# Patient Record
Sex: Male | Born: 1935 | Race: White | Hispanic: No | Marital: Married | Smoking: Former smoker
Health system: Southern US, Community
[De-identification: ages and names within clinical notes are randomized; demographics above are authoritative.]

## PROBLEM LIST (undated history)

## (undated) DIAGNOSIS — I10 Essential (primary) hypertension: Secondary | ICD-10-CM

## (undated) DIAGNOSIS — K219 Gastro-esophageal reflux disease without esophagitis: Secondary | ICD-10-CM

## (undated) DIAGNOSIS — M1990 Unspecified osteoarthritis, unspecified site: Secondary | ICD-10-CM

## (undated) DIAGNOSIS — E1165 Type 2 diabetes mellitus with hyperglycemia: Secondary | ICD-10-CM

## (undated) DIAGNOSIS — C801 Malignant (primary) neoplasm, unspecified: Secondary | ICD-10-CM

## (undated) DIAGNOSIS — I6523 Occlusion and stenosis of bilateral carotid arteries: Secondary | ICD-10-CM

## (undated) DIAGNOSIS — I25119 Atherosclerotic heart disease of native coronary artery with unspecified angina pectoris: Secondary | ICD-10-CM

## (undated) DIAGNOSIS — Z955 Presence of coronary angioplasty implant and graft: Secondary | ICD-10-CM

## (undated) DIAGNOSIS — I219 Acute myocardial infarction, unspecified: Secondary | ICD-10-CM

## (undated) DIAGNOSIS — I499 Cardiac arrhythmia, unspecified: Secondary | ICD-10-CM

## (undated) DIAGNOSIS — E119 Type 2 diabetes mellitus without complications: Secondary | ICD-10-CM

## (undated) DIAGNOSIS — I2510 Atherosclerotic heart disease of native coronary artery without angina pectoris: Secondary | ICD-10-CM

## (undated) DIAGNOSIS — E785 Hyperlipidemia, unspecified: Secondary | ICD-10-CM

## (undated) HISTORY — DX: Gastro-esophageal reflux disease without esophagitis: K21.9

## (undated) HISTORY — DX: Type 2 diabetes mellitus with hyperglycemia: E11.65

## (undated) HISTORY — PX: CATARACT EXTRACTION: SUR2

## (undated) HISTORY — DX: Atherosclerotic heart disease of native coronary artery with unspecified angina pectoris: I25.119

## (undated) HISTORY — PX: BACK SURGERY: SHX140

## (undated) HISTORY — DX: Occlusion and stenosis of bilateral carotid arteries: I65.23

## (undated) HISTORY — PX: SKIN BIOPSY: SHX1

## (undated) HISTORY — PX: SHOULDER ARTHROSCOPY W/ ROTATOR CUFF REPAIR: SHX2400

## (undated) HISTORY — PX: INGUINAL HERNIA REPAIR: SUR1180

## (undated) HISTORY — DX: Hyperlipidemia, unspecified: E78.5

## (undated) HISTORY — DX: Essential (primary) hypertension: I10

## (undated) HISTORY — PX: KNEE ARTHROSCOPY: SUR90

## (undated) HISTORY — PX: CAROTID ENDARTERECTOMY: SUR193

## (undated) HISTORY — DX: Presence of coronary angioplasty implant and graft: Z95.5

---

## 2011-10-15 DIAGNOSIS — M76899 Other specified enthesopathies of unspecified lower limb, excluding foot: Secondary | ICD-10-CM | POA: Diagnosis not present

## 2011-10-15 DIAGNOSIS — M25559 Pain in unspecified hip: Secondary | ICD-10-CM | POA: Diagnosis not present

## 2011-10-18 DIAGNOSIS — M47817 Spondylosis without myelopathy or radiculopathy, lumbosacral region: Secondary | ICD-10-CM | POA: Diagnosis not present

## 2011-10-18 DIAGNOSIS — M5137 Other intervertebral disc degeneration, lumbosacral region: Secondary | ICD-10-CM | POA: Diagnosis not present

## 2011-10-18 DIAGNOSIS — M5126 Other intervertebral disc displacement, lumbar region: Secondary | ICD-10-CM | POA: Diagnosis not present

## 2011-10-22 DIAGNOSIS — M48061 Spinal stenosis, lumbar region without neurogenic claudication: Secondary | ICD-10-CM | POA: Diagnosis not present

## 2011-10-28 DIAGNOSIS — M48061 Spinal stenosis, lumbar region without neurogenic claudication: Secondary | ICD-10-CM | POA: Diagnosis not present

## 2011-10-28 DIAGNOSIS — M4310 Spondylolisthesis, site unspecified: Secondary | ICD-10-CM | POA: Diagnosis not present

## 2011-10-28 DIAGNOSIS — Q762 Congenital spondylolisthesis: Secondary | ICD-10-CM | POA: Diagnosis not present

## 2011-11-11 DIAGNOSIS — M4310 Spondylolisthesis, site unspecified: Secondary | ICD-10-CM | POA: Diagnosis not present

## 2011-11-11 DIAGNOSIS — M4800 Spinal stenosis, site unspecified: Secondary | ICD-10-CM | POA: Diagnosis not present

## 2011-12-01 DIAGNOSIS — G4733 Obstructive sleep apnea (adult) (pediatric): Secondary | ICD-10-CM | POA: Diagnosis not present

## 2011-12-20 DIAGNOSIS — M25569 Pain in unspecified knee: Secondary | ICD-10-CM | POA: Diagnosis not present

## 2011-12-31 DIAGNOSIS — I10 Essential (primary) hypertension: Secondary | ICD-10-CM | POA: Diagnosis not present

## 2011-12-31 DIAGNOSIS — R252 Cramp and spasm: Secondary | ICD-10-CM | POA: Diagnosis not present

## 2011-12-31 DIAGNOSIS — R609 Edema, unspecified: Secondary | ICD-10-CM | POA: Diagnosis not present

## 2011-12-31 DIAGNOSIS — M1710 Unilateral primary osteoarthritis, unspecified knee: Secondary | ICD-10-CM | POA: Diagnosis not present

## 2012-01-01 DIAGNOSIS — R064 Hyperventilation: Secondary | ICD-10-CM | POA: Diagnosis not present

## 2012-01-01 DIAGNOSIS — R609 Edema, unspecified: Secondary | ICD-10-CM | POA: Diagnosis not present

## 2012-01-09 DIAGNOSIS — M25569 Pain in unspecified knee: Secondary | ICD-10-CM | POA: Diagnosis not present

## 2012-01-10 DIAGNOSIS — Q820 Hereditary lymphedema: Secondary | ICD-10-CM | POA: Diagnosis not present

## 2012-01-10 DIAGNOSIS — M25469 Effusion, unspecified knee: Secondary | ICD-10-CM | POA: Diagnosis not present

## 2012-01-10 DIAGNOSIS — M1710 Unilateral primary osteoarthritis, unspecified knee: Secondary | ICD-10-CM | POA: Diagnosis not present

## 2012-01-10 DIAGNOSIS — IMO0002 Reserved for concepts with insufficient information to code with codable children: Secondary | ICD-10-CM | POA: Diagnosis not present

## 2012-01-16 DIAGNOSIS — M25569 Pain in unspecified knee: Secondary | ICD-10-CM | POA: Diagnosis not present

## 2012-01-22 DIAGNOSIS — S83289A Other tear of lateral meniscus, current injury, unspecified knee, initial encounter: Secondary | ICD-10-CM | POA: Diagnosis not present

## 2012-01-22 DIAGNOSIS — IMO0002 Reserved for concepts with insufficient information to code with codable children: Secondary | ICD-10-CM | POA: Diagnosis not present

## 2012-01-22 DIAGNOSIS — M9420 Chondromalacia, unspecified site: Secondary | ICD-10-CM | POA: Diagnosis not present

## 2012-01-22 DIAGNOSIS — X58XXXA Exposure to other specified factors, initial encounter: Secondary | ICD-10-CM | POA: Diagnosis not present

## 2012-01-22 DIAGNOSIS — M2240 Chondromalacia patellae, unspecified knee: Secondary | ICD-10-CM | POA: Diagnosis not present

## 2012-01-22 DIAGNOSIS — Y929 Unspecified place or not applicable: Secondary | ICD-10-CM | POA: Diagnosis not present

## 2012-01-30 DIAGNOSIS — R609 Edema, unspecified: Secondary | ICD-10-CM | POA: Diagnosis not present

## 2012-01-30 DIAGNOSIS — R7309 Other abnormal glucose: Secondary | ICD-10-CM | POA: Diagnosis not present

## 2012-02-27 DIAGNOSIS — Z1211 Encounter for screening for malignant neoplasm of colon: Secondary | ICD-10-CM | POA: Diagnosis not present

## 2012-02-27 DIAGNOSIS — Z79899 Other long term (current) drug therapy: Secondary | ICD-10-CM | POA: Diagnosis not present

## 2012-02-27 DIAGNOSIS — G4700 Insomnia, unspecified: Secondary | ICD-10-CM | POA: Diagnosis not present

## 2012-02-27 DIAGNOSIS — E559 Vitamin D deficiency, unspecified: Secondary | ICD-10-CM | POA: Diagnosis not present

## 2012-02-27 DIAGNOSIS — M1990 Unspecified osteoarthritis, unspecified site: Secondary | ICD-10-CM | POA: Diagnosis not present

## 2012-02-27 DIAGNOSIS — E8881 Metabolic syndrome: Secondary | ICD-10-CM | POA: Diagnosis not present

## 2012-02-27 DIAGNOSIS — Z125 Encounter for screening for malignant neoplasm of prostate: Secondary | ICD-10-CM | POA: Diagnosis not present

## 2012-03-23 DIAGNOSIS — R141 Gas pain: Secondary | ICD-10-CM | POA: Diagnosis not present

## 2012-03-23 DIAGNOSIS — Z79899 Other long term (current) drug therapy: Secondary | ICD-10-CM | POA: Diagnosis not present

## 2012-03-23 DIAGNOSIS — R142 Eructation: Secondary | ICD-10-CM | POA: Diagnosis not present

## 2012-03-26 DIAGNOSIS — I10 Essential (primary) hypertension: Secondary | ICD-10-CM | POA: Diagnosis not present

## 2012-03-26 DIAGNOSIS — Z79899 Other long term (current) drug therapy: Secondary | ICD-10-CM | POA: Diagnosis not present

## 2012-03-31 DIAGNOSIS — R1084 Generalized abdominal pain: Secondary | ICD-10-CM | POA: Diagnosis not present

## 2012-03-31 DIAGNOSIS — R509 Fever, unspecified: Secondary | ICD-10-CM | POA: Diagnosis not present

## 2012-03-31 DIAGNOSIS — R109 Unspecified abdominal pain: Secondary | ICD-10-CM | POA: Diagnosis not present

## 2012-03-31 DIAGNOSIS — R141 Gas pain: Secondary | ICD-10-CM | POA: Diagnosis not present

## 2012-03-31 DIAGNOSIS — M5137 Other intervertebral disc degeneration, lumbosacral region: Secondary | ICD-10-CM | POA: Diagnosis not present

## 2012-03-31 DIAGNOSIS — Q619 Cystic kidney disease, unspecified: Secondary | ICD-10-CM | POA: Diagnosis not present

## 2012-03-31 DIAGNOSIS — R935 Abnormal findings on diagnostic imaging of other abdominal regions, including retroperitoneum: Secondary | ICD-10-CM | POA: Diagnosis not present

## 2012-03-31 DIAGNOSIS — K5730 Diverticulosis of large intestine without perforation or abscess without bleeding: Secondary | ICD-10-CM | POA: Diagnosis not present

## 2012-03-31 DIAGNOSIS — R142 Eructation: Secondary | ICD-10-CM | POA: Diagnosis not present

## 2012-04-07 DIAGNOSIS — R143 Flatulence: Secondary | ICD-10-CM | POA: Diagnosis not present

## 2012-04-07 DIAGNOSIS — R112 Nausea with vomiting, unspecified: Secondary | ICD-10-CM | POA: Diagnosis not present

## 2012-04-07 DIAGNOSIS — R141 Gas pain: Secondary | ICD-10-CM | POA: Diagnosis not present

## 2012-04-07 DIAGNOSIS — R1013 Epigastric pain: Secondary | ICD-10-CM | POA: Diagnosis not present

## 2012-04-08 DIAGNOSIS — K2940 Chronic atrophic gastritis without bleeding: Secondary | ICD-10-CM | POA: Diagnosis not present

## 2012-04-08 DIAGNOSIS — R634 Abnormal weight loss: Secondary | ICD-10-CM | POA: Diagnosis not present

## 2012-04-08 DIAGNOSIS — R110 Nausea: Secondary | ICD-10-CM | POA: Diagnosis not present

## 2012-04-08 DIAGNOSIS — K219 Gastro-esophageal reflux disease without esophagitis: Secondary | ICD-10-CM | POA: Diagnosis not present

## 2012-04-08 DIAGNOSIS — R1013 Epigastric pain: Secondary | ICD-10-CM | POA: Diagnosis not present

## 2012-04-08 DIAGNOSIS — B9689 Other specified bacterial agents as the cause of diseases classified elsewhere: Secondary | ICD-10-CM | POA: Diagnosis not present

## 2012-04-08 DIAGNOSIS — R109 Unspecified abdominal pain: Secondary | ICD-10-CM | POA: Diagnosis not present

## 2012-04-13 DIAGNOSIS — R1013 Epigastric pain: Secondary | ICD-10-CM | POA: Diagnosis not present

## 2012-04-30 DIAGNOSIS — M25539 Pain in unspecified wrist: Secondary | ICD-10-CM | POA: Diagnosis not present

## 2012-04-30 DIAGNOSIS — M109 Gout, unspecified: Secondary | ICD-10-CM | POA: Diagnosis not present

## 2012-04-30 DIAGNOSIS — M25549 Pain in joints of unspecified hand: Secondary | ICD-10-CM | POA: Diagnosis not present

## 2012-04-30 DIAGNOSIS — R52 Pain, unspecified: Secondary | ICD-10-CM | POA: Diagnosis not present

## 2012-05-04 DIAGNOSIS — R143 Flatulence: Secondary | ICD-10-CM | POA: Diagnosis not present

## 2012-05-04 DIAGNOSIS — R1084 Generalized abdominal pain: Secondary | ICD-10-CM | POA: Diagnosis not present

## 2012-05-04 DIAGNOSIS — A048 Other specified bacterial intestinal infections: Secondary | ICD-10-CM | POA: Diagnosis not present

## 2012-05-29 DIAGNOSIS — D485 Neoplasm of uncertain behavior of skin: Secondary | ICD-10-CM | POA: Diagnosis not present

## 2012-05-29 DIAGNOSIS — R233 Spontaneous ecchymoses: Secondary | ICD-10-CM | POA: Diagnosis not present

## 2012-05-29 DIAGNOSIS — L570 Actinic keratosis: Secondary | ICD-10-CM | POA: Diagnosis not present

## 2012-06-03 DIAGNOSIS — R5383 Other fatigue: Secondary | ICD-10-CM | POA: Diagnosis not present

## 2012-06-03 DIAGNOSIS — Z23 Encounter for immunization: Secondary | ICD-10-CM | POA: Diagnosis not present

## 2012-06-03 DIAGNOSIS — R110 Nausea: Secondary | ICD-10-CM | POA: Diagnosis not present

## 2012-06-03 DIAGNOSIS — D539 Nutritional anemia, unspecified: Secondary | ICD-10-CM | POA: Diagnosis not present

## 2012-06-11 DIAGNOSIS — R110 Nausea: Secondary | ICD-10-CM | POA: Diagnosis not present

## 2012-07-08 DIAGNOSIS — A048 Other specified bacterial intestinal infections: Secondary | ICD-10-CM | POA: Diagnosis not present

## 2012-07-14 DIAGNOSIS — K219 Gastro-esophageal reflux disease without esophagitis: Secondary | ICD-10-CM | POA: Diagnosis not present

## 2012-07-14 DIAGNOSIS — R110 Nausea: Secondary | ICD-10-CM | POA: Diagnosis not present

## 2013-01-24 DIAGNOSIS — L03221 Cellulitis of neck: Secondary | ICD-10-CM | POA: Diagnosis not present

## 2013-01-24 DIAGNOSIS — L0291 Cutaneous abscess, unspecified: Secondary | ICD-10-CM | POA: Diagnosis not present

## 2013-03-15 DIAGNOSIS — H26019 Infantile and juvenile cortical, lamellar, or zonular cataract, unspecified eye: Secondary | ICD-10-CM | POA: Diagnosis not present

## 2013-03-15 DIAGNOSIS — H2510 Age-related nuclear cataract, unspecified eye: Secondary | ICD-10-CM | POA: Diagnosis not present

## 2013-03-15 DIAGNOSIS — H26109 Unspecified traumatic cataract, unspecified eye: Secondary | ICD-10-CM | POA: Diagnosis not present

## 2013-03-16 DIAGNOSIS — I10 Essential (primary) hypertension: Secondary | ICD-10-CM | POA: Diagnosis not present

## 2013-03-16 DIAGNOSIS — Z13220 Encounter for screening for lipoid disorders: Secondary | ICD-10-CM | POA: Diagnosis not present

## 2013-03-16 DIAGNOSIS — Z130 Encounter for screening for diseases of the blood and blood-forming organs and certain disorders involving the immune mechanism: Secondary | ICD-10-CM | POA: Diagnosis not present

## 2013-03-16 DIAGNOSIS — M2550 Pain in unspecified joint: Secondary | ICD-10-CM | POA: Diagnosis not present

## 2013-03-31 DIAGNOSIS — H2510 Age-related nuclear cataract, unspecified eye: Secondary | ICD-10-CM | POA: Diagnosis not present

## 2013-04-07 DIAGNOSIS — H2510 Age-related nuclear cataract, unspecified eye: Secondary | ICD-10-CM | POA: Diagnosis not present

## 2013-04-07 DIAGNOSIS — H26019 Infantile and juvenile cortical, lamellar, or zonular cataract, unspecified eye: Secondary | ICD-10-CM | POA: Diagnosis not present

## 2013-06-25 DIAGNOSIS — Z23 Encounter for immunization: Secondary | ICD-10-CM | POA: Diagnosis not present

## 2013-07-01 DIAGNOSIS — R0789 Other chest pain: Secondary | ICD-10-CM | POA: Diagnosis not present

## 2013-07-01 DIAGNOSIS — I495 Sick sinus syndrome: Secondary | ICD-10-CM | POA: Diagnosis not present

## 2013-07-01 DIAGNOSIS — Z79899 Other long term (current) drug therapy: Secondary | ICD-10-CM | POA: Diagnosis not present

## 2013-07-01 DIAGNOSIS — Z7982 Long term (current) use of aspirin: Secondary | ICD-10-CM | POA: Diagnosis not present

## 2013-07-01 DIAGNOSIS — R002 Palpitations: Secondary | ICD-10-CM | POA: Diagnosis not present

## 2013-07-01 DIAGNOSIS — K219 Gastro-esophageal reflux disease without esophagitis: Secondary | ICD-10-CM | POA: Diagnosis not present

## 2013-07-01 DIAGNOSIS — E7800 Pure hypercholesterolemia, unspecified: Secondary | ICD-10-CM | POA: Diagnosis not present

## 2013-07-01 DIAGNOSIS — I10 Essential (primary) hypertension: Secondary | ICD-10-CM | POA: Diagnosis not present

## 2013-07-01 DIAGNOSIS — M810 Age-related osteoporosis without current pathological fracture: Secondary | ICD-10-CM | POA: Diagnosis not present

## 2013-07-01 DIAGNOSIS — I471 Supraventricular tachycardia: Secondary | ICD-10-CM | POA: Diagnosis not present

## 2013-07-01 DIAGNOSIS — I200 Unstable angina: Secondary | ICD-10-CM | POA: Diagnosis not present

## 2013-07-01 DIAGNOSIS — I498 Other specified cardiac arrhythmias: Secondary | ICD-10-CM | POA: Diagnosis not present

## 2013-07-01 DIAGNOSIS — R072 Precordial pain: Secondary | ICD-10-CM | POA: Diagnosis not present

## 2013-07-01 DIAGNOSIS — G4700 Insomnia, unspecified: Secondary | ICD-10-CM | POA: Diagnosis not present

## 2013-07-01 DIAGNOSIS — I248 Other forms of acute ischemic heart disease: Secondary | ICD-10-CM | POA: Diagnosis not present

## 2013-07-01 DIAGNOSIS — R6884 Jaw pain: Secondary | ICD-10-CM | POA: Diagnosis not present

## 2013-07-01 DIAGNOSIS — R079 Chest pain, unspecified: Secondary | ICD-10-CM | POA: Diagnosis not present

## 2013-07-02 DIAGNOSIS — Z79899 Other long term (current) drug therapy: Secondary | ICD-10-CM | POA: Diagnosis not present

## 2013-07-02 DIAGNOSIS — K219 Gastro-esophageal reflux disease without esophagitis: Secondary | ICD-10-CM | POA: Diagnosis not present

## 2013-07-02 DIAGNOSIS — I359 Nonrheumatic aortic valve disorder, unspecified: Secondary | ICD-10-CM | POA: Diagnosis not present

## 2013-07-02 DIAGNOSIS — R079 Chest pain, unspecified: Secondary | ICD-10-CM | POA: Diagnosis not present

## 2013-07-02 DIAGNOSIS — I2510 Atherosclerotic heart disease of native coronary artery without angina pectoris: Secondary | ICD-10-CM | POA: Diagnosis not present

## 2013-07-02 DIAGNOSIS — I498 Other specified cardiac arrhythmias: Secondary | ICD-10-CM | POA: Diagnosis not present

## 2013-07-02 DIAGNOSIS — I248 Other forms of acute ischemic heart disease: Secondary | ICD-10-CM | POA: Diagnosis not present

## 2013-07-02 DIAGNOSIS — I214 Non-ST elevation (NSTEMI) myocardial infarction: Secondary | ICD-10-CM | POA: Diagnosis not present

## 2013-07-02 DIAGNOSIS — Z23 Encounter for immunization: Secondary | ICD-10-CM | POA: Diagnosis not present

## 2013-07-02 DIAGNOSIS — I10 Essential (primary) hypertension: Secondary | ICD-10-CM | POA: Diagnosis not present

## 2013-07-02 DIAGNOSIS — E876 Hypokalemia: Secondary | ICD-10-CM | POA: Diagnosis not present

## 2013-07-02 DIAGNOSIS — Z7982 Long term (current) use of aspirin: Secondary | ICD-10-CM | POA: Diagnosis not present

## 2013-07-02 DIAGNOSIS — I517 Cardiomegaly: Secondary | ICD-10-CM | POA: Diagnosis not present

## 2013-07-02 DIAGNOSIS — I209 Angina pectoris, unspecified: Secondary | ICD-10-CM | POA: Diagnosis not present

## 2013-07-02 DIAGNOSIS — I059 Rheumatic mitral valve disease, unspecified: Secondary | ICD-10-CM | POA: Diagnosis not present

## 2013-07-03 DIAGNOSIS — Z23 Encounter for immunization: Secondary | ICD-10-CM | POA: Diagnosis not present

## 2013-07-03 DIAGNOSIS — I214 Non-ST elevation (NSTEMI) myocardial infarction: Secondary | ICD-10-CM | POA: Diagnosis not present

## 2013-07-03 DIAGNOSIS — I10 Essential (primary) hypertension: Secondary | ICD-10-CM | POA: Diagnosis not present

## 2013-07-03 DIAGNOSIS — E876 Hypokalemia: Secondary | ICD-10-CM | POA: Diagnosis not present

## 2013-07-03 DIAGNOSIS — Z79899 Other long term (current) drug therapy: Secondary | ICD-10-CM | POA: Diagnosis not present

## 2013-07-03 DIAGNOSIS — I2510 Atherosclerotic heart disease of native coronary artery without angina pectoris: Secondary | ICD-10-CM | POA: Diagnosis not present

## 2013-07-03 DIAGNOSIS — I209 Angina pectoris, unspecified: Secondary | ICD-10-CM | POA: Diagnosis not present

## 2013-07-03 DIAGNOSIS — Z7982 Long term (current) use of aspirin: Secondary | ICD-10-CM | POA: Diagnosis not present

## 2013-07-03 DIAGNOSIS — Z9861 Coronary angioplasty status: Secondary | ICD-10-CM | POA: Diagnosis not present

## 2013-07-19 DIAGNOSIS — I2510 Atherosclerotic heart disease of native coronary artery without angina pectoris: Secondary | ICD-10-CM | POA: Diagnosis not present

## 2013-07-19 DIAGNOSIS — I10 Essential (primary) hypertension: Secondary | ICD-10-CM | POA: Diagnosis not present

## 2013-07-19 DIAGNOSIS — I6529 Occlusion and stenosis of unspecified carotid artery: Secondary | ICD-10-CM | POA: Diagnosis not present

## 2013-07-19 DIAGNOSIS — I456 Pre-excitation syndrome: Secondary | ICD-10-CM | POA: Diagnosis not present

## 2013-08-04 DIAGNOSIS — E782 Mixed hyperlipidemia: Secondary | ICD-10-CM | POA: Diagnosis not present

## 2013-08-04 DIAGNOSIS — I2510 Atherosclerotic heart disease of native coronary artery without angina pectoris: Secondary | ICD-10-CM | POA: Diagnosis not present

## 2013-08-05 DIAGNOSIS — I2510 Atherosclerotic heart disease of native coronary artery without angina pectoris: Secondary | ICD-10-CM | POA: Diagnosis not present

## 2013-08-05 DIAGNOSIS — I10 Essential (primary) hypertension: Secondary | ICD-10-CM | POA: Diagnosis not present

## 2013-08-05 DIAGNOSIS — E782 Mixed hyperlipidemia: Secondary | ICD-10-CM | POA: Diagnosis not present

## 2013-08-05 DIAGNOSIS — Z006 Encounter for examination for normal comparison and control in clinical research program: Secondary | ICD-10-CM | POA: Diagnosis not present

## 2013-08-09 DIAGNOSIS — Z961 Presence of intraocular lens: Secondary | ICD-10-CM | POA: Diagnosis not present

## 2013-08-10 DIAGNOSIS — I2510 Atherosclerotic heart disease of native coronary artery without angina pectoris: Secondary | ICD-10-CM | POA: Diagnosis not present

## 2013-08-10 DIAGNOSIS — I6529 Occlusion and stenosis of unspecified carotid artery: Secondary | ICD-10-CM | POA: Diagnosis not present

## 2013-08-10 DIAGNOSIS — I10 Essential (primary) hypertension: Secondary | ICD-10-CM | POA: Diagnosis not present

## 2013-08-18 DIAGNOSIS — M67919 Unspecified disorder of synovium and tendon, unspecified shoulder: Secondary | ICD-10-CM | POA: Diagnosis not present

## 2013-08-18 DIAGNOSIS — M25519 Pain in unspecified shoulder: Secondary | ICD-10-CM | POA: Diagnosis not present

## 2013-08-24 DIAGNOSIS — I119 Hypertensive heart disease without heart failure: Secondary | ICD-10-CM | POA: Diagnosis not present

## 2013-08-24 DIAGNOSIS — I2510 Atherosclerotic heart disease of native coronary artery without angina pectoris: Secondary | ICD-10-CM | POA: Diagnosis not present

## 2013-08-24 DIAGNOSIS — I6529 Occlusion and stenosis of unspecified carotid artery: Secondary | ICD-10-CM | POA: Diagnosis not present

## 2013-09-15 DIAGNOSIS — I2510 Atherosclerotic heart disease of native coronary artery without angina pectoris: Secondary | ICD-10-CM | POA: Diagnosis present

## 2013-09-15 DIAGNOSIS — Z9861 Coronary angioplasty status: Secondary | ICD-10-CM | POA: Diagnosis not present

## 2013-09-15 DIAGNOSIS — I6529 Occlusion and stenosis of unspecified carotid artery: Secondary | ICD-10-CM | POA: Diagnosis not present

## 2013-09-15 DIAGNOSIS — Z87891 Personal history of nicotine dependence: Secondary | ICD-10-CM | POA: Diagnosis not present

## 2013-09-15 DIAGNOSIS — I119 Hypertensive heart disease without heart failure: Secondary | ICD-10-CM | POA: Diagnosis present

## 2013-09-15 DIAGNOSIS — Z85820 Personal history of malignant melanoma of skin: Secondary | ICD-10-CM | POA: Diagnosis not present

## 2013-09-15 DIAGNOSIS — I079 Rheumatic tricuspid valve disease, unspecified: Secondary | ICD-10-CM | POA: Diagnosis present

## 2013-09-15 DIAGNOSIS — E669 Obesity, unspecified: Secondary | ICD-10-CM | POA: Diagnosis present

## 2013-09-15 DIAGNOSIS — J40 Bronchitis, not specified as acute or chronic: Secondary | ICD-10-CM | POA: Diagnosis not present

## 2013-09-15 DIAGNOSIS — Z7982 Long term (current) use of aspirin: Secondary | ICD-10-CM | POA: Diagnosis not present

## 2013-09-15 DIAGNOSIS — Z791 Long term (current) use of non-steroidal anti-inflammatories (NSAID): Secondary | ICD-10-CM | POA: Diagnosis not present

## 2013-09-15 DIAGNOSIS — Z6831 Body mass index (BMI) 31.0-31.9, adult: Secondary | ICD-10-CM | POA: Diagnosis not present

## 2013-12-01 DIAGNOSIS — I059 Rheumatic mitral valve disease, unspecified: Secondary | ICD-10-CM | POA: Diagnosis not present

## 2013-12-01 DIAGNOSIS — I2510 Atherosclerotic heart disease of native coronary artery without angina pectoris: Secondary | ICD-10-CM | POA: Diagnosis not present

## 2013-12-01 DIAGNOSIS — I6529 Occlusion and stenosis of unspecified carotid artery: Secondary | ICD-10-CM | POA: Diagnosis not present

## 2013-12-01 DIAGNOSIS — I119 Hypertensive heart disease without heart failure: Secondary | ICD-10-CM | POA: Diagnosis not present

## 2013-12-01 DIAGNOSIS — I471 Supraventricular tachycardia: Secondary | ICD-10-CM | POA: Diagnosis not present

## 2013-12-09 DIAGNOSIS — R059 Cough, unspecified: Secondary | ICD-10-CM | POA: Diagnosis not present

## 2013-12-09 DIAGNOSIS — R05 Cough: Secondary | ICD-10-CM | POA: Diagnosis not present

## 2013-12-09 DIAGNOSIS — I951 Orthostatic hypotension: Secondary | ICD-10-CM | POA: Diagnosis not present

## 2013-12-27 DIAGNOSIS — Z961 Presence of intraocular lens: Secondary | ICD-10-CM | POA: Diagnosis not present

## 2014-03-24 DIAGNOSIS — I6529 Occlusion and stenosis of unspecified carotid artery: Secondary | ICD-10-CM | POA: Diagnosis not present

## 2014-03-24 DIAGNOSIS — I658 Occlusion and stenosis of other precerebral arteries: Secondary | ICD-10-CM | POA: Diagnosis not present

## 2014-03-25 DIAGNOSIS — Z125 Encounter for screening for malignant neoplasm of prostate: Secondary | ICD-10-CM | POA: Diagnosis not present

## 2014-03-25 DIAGNOSIS — E782 Mixed hyperlipidemia: Secondary | ICD-10-CM | POA: Diagnosis not present

## 2014-03-25 DIAGNOSIS — E559 Vitamin D deficiency, unspecified: Secondary | ICD-10-CM | POA: Diagnosis not present

## 2014-03-25 DIAGNOSIS — I10 Essential (primary) hypertension: Secondary | ICD-10-CM | POA: Diagnosis not present

## 2014-03-25 DIAGNOSIS — N138 Other obstructive and reflux uropathy: Secondary | ICD-10-CM | POA: Diagnosis not present

## 2014-03-25 DIAGNOSIS — N401 Enlarged prostate with lower urinary tract symptoms: Secondary | ICD-10-CM | POA: Diagnosis not present

## 2014-03-25 DIAGNOSIS — M1990 Unspecified osteoarthritis, unspecified site: Secondary | ICD-10-CM | POA: Diagnosis not present

## 2014-03-25 DIAGNOSIS — G4700 Insomnia, unspecified: Secondary | ICD-10-CM | POA: Diagnosis not present

## 2014-03-25 DIAGNOSIS — Z79899 Other long term (current) drug therapy: Secondary | ICD-10-CM | POA: Diagnosis not present

## 2014-04-18 DIAGNOSIS — I10 Essential (primary) hypertension: Secondary | ICD-10-CM | POA: Diagnosis not present

## 2014-04-18 DIAGNOSIS — I2510 Atherosclerotic heart disease of native coronary artery without angina pectoris: Secondary | ICD-10-CM | POA: Diagnosis not present

## 2014-04-18 DIAGNOSIS — E785 Hyperlipidemia, unspecified: Secondary | ICD-10-CM | POA: Diagnosis not present

## 2014-05-10 DIAGNOSIS — C4359 Malignant melanoma of other part of trunk: Secondary | ICD-10-CM | POA: Diagnosis not present

## 2014-05-10 DIAGNOSIS — L570 Actinic keratosis: Secondary | ICD-10-CM | POA: Diagnosis not present

## 2014-05-10 DIAGNOSIS — L723 Sebaceous cyst: Secondary | ICD-10-CM | POA: Diagnosis not present

## 2014-05-10 DIAGNOSIS — D485 Neoplasm of uncertain behavior of skin: Secondary | ICD-10-CM | POA: Diagnosis not present

## 2014-05-19 DIAGNOSIS — C4359 Malignant melanoma of other part of trunk: Secondary | ICD-10-CM | POA: Diagnosis not present

## 2014-06-09 DIAGNOSIS — Z23 Encounter for immunization: Secondary | ICD-10-CM | POA: Diagnosis not present

## 2014-08-09 DIAGNOSIS — L570 Actinic keratosis: Secondary | ICD-10-CM | POA: Diagnosis not present

## 2014-08-09 DIAGNOSIS — C44319 Basal cell carcinoma of skin of other parts of face: Secondary | ICD-10-CM | POA: Diagnosis not present

## 2014-08-09 DIAGNOSIS — D485 Neoplasm of uncertain behavior of skin: Secondary | ICD-10-CM | POA: Diagnosis not present

## 2014-08-09 DIAGNOSIS — Z0000 Encounter for general adult medical examination without abnormal findings: Secondary | ICD-10-CM | POA: Diagnosis not present

## 2014-08-09 DIAGNOSIS — I10 Essential (primary) hypertension: Secondary | ICD-10-CM | POA: Diagnosis not present

## 2014-08-09 DIAGNOSIS — D0339 Melanoma in situ of other parts of face: Secondary | ICD-10-CM | POA: Diagnosis not present

## 2014-08-18 DIAGNOSIS — D0339 Melanoma in situ of other parts of face: Secondary | ICD-10-CM | POA: Diagnosis not present

## 2014-08-18 DIAGNOSIS — C44319 Basal cell carcinoma of skin of other parts of face: Secondary | ICD-10-CM | POA: Diagnosis not present

## 2014-09-02 DIAGNOSIS — Z7982 Long term (current) use of aspirin: Secondary | ICD-10-CM | POA: Diagnosis not present

## 2014-09-02 DIAGNOSIS — I10 Essential (primary) hypertension: Secondary | ICD-10-CM | POA: Diagnosis not present

## 2014-09-02 DIAGNOSIS — Z87891 Personal history of nicotine dependence: Secondary | ICD-10-CM | POA: Diagnosis not present

## 2014-09-02 DIAGNOSIS — R040 Epistaxis: Secondary | ICD-10-CM | POA: Diagnosis not present

## 2014-09-02 DIAGNOSIS — I4891 Unspecified atrial fibrillation: Secondary | ICD-10-CM | POA: Diagnosis not present

## 2014-09-02 DIAGNOSIS — E780 Pure hypercholesterolemia: Secondary | ICD-10-CM | POA: Diagnosis not present

## 2014-09-06 DIAGNOSIS — R7309 Other abnormal glucose: Secondary | ICD-10-CM | POA: Diagnosis not present

## 2014-09-06 DIAGNOSIS — I10 Essential (primary) hypertension: Secondary | ICD-10-CM | POA: Diagnosis not present

## 2014-09-06 DIAGNOSIS — Z23 Encounter for immunization: Secondary | ICD-10-CM | POA: Diagnosis not present

## 2014-10-13 DIAGNOSIS — I10 Essential (primary) hypertension: Secondary | ICD-10-CM | POA: Diagnosis not present

## 2014-10-13 DIAGNOSIS — E1165 Type 2 diabetes mellitus with hyperglycemia: Secondary | ICD-10-CM | POA: Diagnosis not present

## 2014-12-06 DIAGNOSIS — L570 Actinic keratosis: Secondary | ICD-10-CM | POA: Diagnosis not present

## 2014-12-06 DIAGNOSIS — L219 Seborrheic dermatitis, unspecified: Secondary | ICD-10-CM | POA: Diagnosis not present

## 2014-12-08 DIAGNOSIS — I10 Essential (primary) hypertension: Secondary | ICD-10-CM | POA: Diagnosis not present

## 2015-04-03 DIAGNOSIS — I6523 Occlusion and stenosis of bilateral carotid arteries: Secondary | ICD-10-CM | POA: Diagnosis not present

## 2015-04-03 DIAGNOSIS — I6529 Occlusion and stenosis of unspecified carotid artery: Secondary | ICD-10-CM | POA: Diagnosis not present

## 2015-04-13 DIAGNOSIS — E1165 Type 2 diabetes mellitus with hyperglycemia: Secondary | ICD-10-CM | POA: Diagnosis not present

## 2015-04-13 DIAGNOSIS — I10 Essential (primary) hypertension: Secondary | ICD-10-CM | POA: Diagnosis not present

## 2015-04-13 DIAGNOSIS — I119 Hypertensive heart disease without heart failure: Secondary | ICD-10-CM | POA: Diagnosis not present

## 2015-04-13 DIAGNOSIS — Z125 Encounter for screening for malignant neoplasm of prostate: Secondary | ICD-10-CM | POA: Diagnosis not present

## 2015-04-13 DIAGNOSIS — K219 Gastro-esophageal reflux disease without esophagitis: Secondary | ICD-10-CM | POA: Diagnosis not present

## 2015-04-13 DIAGNOSIS — N401 Enlarged prostate with lower urinary tract symptoms: Secondary | ICD-10-CM | POA: Diagnosis not present

## 2015-04-13 DIAGNOSIS — E782 Mixed hyperlipidemia: Secondary | ICD-10-CM | POA: Diagnosis not present

## 2015-04-14 DIAGNOSIS — Z1211 Encounter for screening for malignant neoplasm of colon: Secondary | ICD-10-CM | POA: Diagnosis not present

## 2015-06-07 DIAGNOSIS — Z23 Encounter for immunization: Secondary | ICD-10-CM | POA: Diagnosis not present

## 2015-06-08 DIAGNOSIS — L570 Actinic keratosis: Secondary | ICD-10-CM | POA: Diagnosis not present

## 2015-06-08 DIAGNOSIS — L219 Seborrheic dermatitis, unspecified: Secondary | ICD-10-CM | POA: Diagnosis not present

## 2015-07-28 DIAGNOSIS — I10 Essential (primary) hypertension: Secondary | ICD-10-CM | POA: Diagnosis not present

## 2015-08-22 DIAGNOSIS — I10 Essential (primary) hypertension: Secondary | ICD-10-CM | POA: Insufficient documentation

## 2015-08-22 DIAGNOSIS — I25119 Atherosclerotic heart disease of native coronary artery with unspecified angina pectoris: Secondary | ICD-10-CM

## 2015-08-22 DIAGNOSIS — E785 Hyperlipidemia, unspecified: Secondary | ICD-10-CM

## 2015-08-22 HISTORY — DX: Atherosclerotic heart disease of native coronary artery with unspecified angina pectoris: I25.119

## 2015-08-22 HISTORY — DX: Hyperlipidemia, unspecified: E78.5

## 2015-08-22 HISTORY — DX: Essential (primary) hypertension: I10

## 2015-08-23 DIAGNOSIS — I25119 Atherosclerotic heart disease of native coronary artery with unspecified angina pectoris: Secondary | ICD-10-CM | POA: Diagnosis not present

## 2015-08-23 DIAGNOSIS — E785 Hyperlipidemia, unspecified: Secondary | ICD-10-CM | POA: Diagnosis not present

## 2015-08-23 DIAGNOSIS — I10 Essential (primary) hypertension: Secondary | ICD-10-CM | POA: Diagnosis not present

## 2015-09-01 DIAGNOSIS — M25511 Pain in right shoulder: Secondary | ICD-10-CM | POA: Diagnosis not present

## 2015-09-07 DIAGNOSIS — M79601 Pain in right arm: Secondary | ICD-10-CM | POA: Diagnosis not present

## 2015-09-07 DIAGNOSIS — M25611 Stiffness of right shoulder, not elsewhere classified: Secondary | ICD-10-CM | POA: Diagnosis not present

## 2015-09-07 DIAGNOSIS — M25511 Pain in right shoulder: Secondary | ICD-10-CM | POA: Diagnosis not present

## 2015-09-14 DIAGNOSIS — M79601 Pain in right arm: Secondary | ICD-10-CM | POA: Diagnosis not present

## 2015-09-14 DIAGNOSIS — M25511 Pain in right shoulder: Secondary | ICD-10-CM | POA: Diagnosis not present

## 2015-09-14 DIAGNOSIS — M25611 Stiffness of right shoulder, not elsewhere classified: Secondary | ICD-10-CM | POA: Diagnosis not present

## 2015-09-20 DIAGNOSIS — M25511 Pain in right shoulder: Secondary | ICD-10-CM | POA: Diagnosis not present

## 2015-09-20 DIAGNOSIS — M79601 Pain in right arm: Secondary | ICD-10-CM | POA: Diagnosis not present

## 2015-09-20 DIAGNOSIS — M25611 Stiffness of right shoulder, not elsewhere classified: Secondary | ICD-10-CM | POA: Diagnosis not present

## 2015-09-22 DIAGNOSIS — M79601 Pain in right arm: Secondary | ICD-10-CM | POA: Diagnosis not present

## 2015-09-22 DIAGNOSIS — M25611 Stiffness of right shoulder, not elsewhere classified: Secondary | ICD-10-CM | POA: Diagnosis not present

## 2015-09-22 DIAGNOSIS — M25511 Pain in right shoulder: Secondary | ICD-10-CM | POA: Diagnosis not present

## 2015-09-26 DIAGNOSIS — M25511 Pain in right shoulder: Secondary | ICD-10-CM | POA: Diagnosis not present

## 2015-09-26 DIAGNOSIS — M25611 Stiffness of right shoulder, not elsewhere classified: Secondary | ICD-10-CM | POA: Diagnosis not present

## 2015-09-26 DIAGNOSIS — M79601 Pain in right arm: Secondary | ICD-10-CM | POA: Diagnosis not present

## 2015-10-05 DIAGNOSIS — M2540 Effusion, unspecified joint: Secondary | ICD-10-CM | POA: Diagnosis not present

## 2015-10-05 DIAGNOSIS — M19011 Primary osteoarthritis, right shoulder: Secondary | ICD-10-CM | POA: Diagnosis not present

## 2015-10-05 DIAGNOSIS — M75101 Unspecified rotator cuff tear or rupture of right shoulder, not specified as traumatic: Secondary | ICD-10-CM | POA: Diagnosis not present

## 2015-10-05 DIAGNOSIS — M25511 Pain in right shoulder: Secondary | ICD-10-CM | POA: Diagnosis not present

## 2015-10-06 DIAGNOSIS — M75121 Complete rotator cuff tear or rupture of right shoulder, not specified as traumatic: Secondary | ICD-10-CM | POA: Diagnosis not present

## 2015-10-25 DIAGNOSIS — M19011 Primary osteoarthritis, right shoulder: Secondary | ICD-10-CM | POA: Diagnosis not present

## 2015-10-31 DIAGNOSIS — C44119 Basal cell carcinoma of skin of left eyelid, including canthus: Secondary | ICD-10-CM | POA: Diagnosis not present

## 2015-12-13 DIAGNOSIS — C44119 Basal cell carcinoma of skin of left eyelid, including canthus: Secondary | ICD-10-CM | POA: Diagnosis not present

## 2015-12-22 ENCOUNTER — Other Ambulatory Visit: Payer: Self-pay | Admitting: Orthopedic Surgery

## 2015-12-22 DIAGNOSIS — M19011 Primary osteoarthritis, right shoulder: Secondary | ICD-10-CM

## 2015-12-28 ENCOUNTER — Ambulatory Visit
Admission: RE | Admit: 2015-12-28 | Discharge: 2015-12-28 | Disposition: A | Discharge status: Home / Self Care | Payer: Medicare Other | Source: Ambulatory Visit | Attending: Orthopedic Surgery | Admitting: Orthopedic Surgery

## 2015-12-28 DIAGNOSIS — M19011 Primary osteoarthritis, right shoulder: Secondary | ICD-10-CM

## 2016-01-24 DIAGNOSIS — M19011 Primary osteoarthritis, right shoulder: Secondary | ICD-10-CM | POA: Diagnosis not present

## 2016-02-14 DIAGNOSIS — Z01810 Encounter for preprocedural cardiovascular examination: Secondary | ICD-10-CM | POA: Diagnosis not present

## 2016-02-14 DIAGNOSIS — I25119 Atherosclerotic heart disease of native coronary artery with unspecified angina pectoris: Secondary | ICD-10-CM | POA: Diagnosis not present

## 2016-02-14 DIAGNOSIS — E785 Hyperlipidemia, unspecified: Secondary | ICD-10-CM | POA: Diagnosis not present

## 2016-02-14 DIAGNOSIS — I10 Essential (primary) hypertension: Secondary | ICD-10-CM | POA: Diagnosis not present

## 2016-03-07 ENCOUNTER — Other Ambulatory Visit: Payer: Self-pay | Admitting: Orthopedic Surgery

## 2016-03-21 NOTE — Pre-Procedure Instructions (Addendum)
Tally Ehlke  03/21/2016     No Pharmacies Listed   Your procedure is scheduled on July 13 thursday  Report to [facility] Admitting at 745 A.M.  Call this number if you have problems the morning of surgery:  [phone]   Remember:  Do not eat food or drink liquids after midnight.  Take these medicines the morning of surgery with A SIP OF WATER carvedilol(coreg),nitro if needed, protonix   Stop taking aspirin, Ibuprofen, Advil, Motrin, Herbal medications, Vitamins, Fish Oil, BC's, Goody's, Aleve,all vitamins starting today,meloxicam   Stop plavix per dr  No metformin am of surgery    How to Manage Your Diabetes Before and After Surgery  Why is it important to control my blood sugar before and after surgery? . Improving blood sugar levels before and after surgery helps healing and can limit problems. . A way of improving blood sugar control is eating a healthy diet by: o  Eating less sugar and carbohydrates o  Increasing activity/exercise o  Talking with your doctor about reaching your blood sugar goals . High blood sugars (greater than 180 mg/dL) can raise your risk of infections and slow your recovery, so you will need to focus on controlling your diabetes during the weeks before surgery. . Make sure that the doctor who takes care of your diabetes knows about your planned surgery including the date and location.  How do I manage my blood sugar before surgery? . Check your blood sugar at least 4 times a day, starting 2 days before surgery, to make sure that the level is not too high or low. o Check your blood sugar the morning of your surgery when you wake up and every 2 hours until you get to the Short Stay unit. . If your blood sugar is less than 70 mg/dL, you will need to treat for low blood sugar: o Do not take insulin. o Treat a low blood sugar (less than 70 mg/dL) with  cup of clear juice (cranberry or apple), 4 glucose tablets, OR glucose gel. o Recheck  blood sugar in 15 minutes after treatment (to make sure it is greater than 70 mg/dL). If your blood sugar is not greater than 70 mg/dL on recheck, call [phone] for further instructions. . Report your blood sugar to the short stay nurse when you get to Short Stay.  . If you are admitted to the hospital after surgery: o Your blood sugar will be checked by the staff and you will probably be given insulin after surgery (instead of oral diabetes medicines) to make sure you have good blood sugar levels. o The goal for blood sugar control after surgery is 80-180 mg/dL.      WHAT DO I DO ABOUT MY DIABETES MEDICATION?   Marland Kitchen Do not take oral diabetes medicines (pills) the morning of surgery. NO metformin  .  Do not wear jewelry, make-up or nail polish.  Do not wear lotions, powders, or perfumes.  You may wear deoderant.  Do not shave 48 hours prior to surgery.  Men may shave face and neck.  Do not bring valuables to the hospital.  [facility] is not responsible for any belongings or valuables.  Contacts, dentures or bridgework may not be worn into surgery.  Leave your suitcase in the car.  After surgery it may be brought to your room.  For patients admitted to the hospital, discharge time will be determined by your treatment team.  Patients discharged the day  of surgery will not be allowed to drive home.   Special instructions:  [facility] - Preparing for Surgery  Before surgery, you can play an important role.  Because skin is not sterile, your skin needs to be as free of germs as possible.  You can reduce the number of germs on you skin by washing with CHG (chlorahexidine gluconate) soap before surgery.  CHG is an antiseptic cleaner which kills germs and bonds with the skin to continue killing germs even after washing.  Please DO NOT use if you have an allergy to CHG or antibacterial soaps.  If your skin becomes reddened/irritated stop using the CHG and inform your nurse when you arrive at  Short Stay.  Do not shave (including legs and underarms) for at least 48 hours prior to the first CHG shower.  You may shave your face.  Please follow these instructions carefully:   1.  Shower with CHG Soap the night before surgery and the   morning of Surgery.  2.  If you choose to wash your hair, wash your hair first as usual with your   normal shampoo.  3.  After you shampoo, rinse your hair and body thoroughly to remove the Shampoo.  4.  Use CHG as you would any other liquid soap.  You can apply chg directly to the skin and wash gently with scrungie or a clean washcloth.  5.  Apply the CHG Soap to your body ONLY FROM THE NECK DOWN.   Do not use on open wounds or open sores.  Avoid contact with your eyes, ears, mouth and genitals (private parts).  Wash genitals (private parts)  with your normal soap.  6.  Wash thoroughly, paying special attention to the area where your surgery  will be performed.  7.  Thoroughly rinse your body with warm water from the neck down.  8.  DO NOT shower/wash with your normal soap after using and rinsing off  the CHG Soap.  9.  Pat yourself dry with a clean towel.            10.  Wear clean pajamas.            11.  Place clean sheets on your bed the night of your first shower and do not sleep with pets.  Day of Surgery  Do not apply any lotions/deoderants the morning of surgery.  Please wear clean clothes to the hospital/surgery center.     Please read over the following fact sheets that you were given. Pain Booklet, MRSA Information and Surgical Site Infection Prevention

## 2016-03-22 ENCOUNTER — Encounter (HOSPITAL_COMMUNITY)
Admission: RE | Admit: 2016-03-22 | Discharge: 2016-03-22 | Disposition: A | Discharge status: Home / Self Care | Payer: Medicare Other | Source: Ambulatory Visit | Attending: Orthopedic Surgery | Admitting: Orthopedic Surgery

## 2016-03-22 ENCOUNTER — Encounter (HOSPITAL_COMMUNITY): Payer: Self-pay

## 2016-03-22 ENCOUNTER — Ambulatory Visit (HOSPITAL_COMMUNITY)
Admission: RE | Admit: 2016-03-22 | Discharge: 2016-03-22 | Disposition: A | Discharge status: Home / Self Care | Payer: Medicare Other | Source: Ambulatory Visit | Attending: Orthopedic Surgery | Admitting: Orthopedic Surgery

## 2016-03-22 DIAGNOSIS — M12811 Other specific arthropathies, not elsewhere classified, right shoulder: Secondary | ICD-10-CM | POA: Insufficient documentation

## 2016-03-22 DIAGNOSIS — Z0183 Encounter for blood typing: Secondary | ICD-10-CM | POA: Insufficient documentation

## 2016-03-22 DIAGNOSIS — I700 Atherosclerosis of aorta: Secondary | ICD-10-CM | POA: Diagnosis not present

## 2016-03-22 DIAGNOSIS — Z01818 Encounter for other preprocedural examination: Secondary | ICD-10-CM | POA: Insufficient documentation

## 2016-03-22 DIAGNOSIS — Z01812 Encounter for preprocedural laboratory examination: Secondary | ICD-10-CM | POA: Diagnosis not present

## 2016-03-22 HISTORY — DX: Essential (primary) hypertension: I10

## 2016-03-22 HISTORY — DX: Cardiac arrhythmia, unspecified: I49.9

## 2016-03-22 HISTORY — DX: Type 2 diabetes mellitus without complications: E11.9

## 2016-03-22 HISTORY — DX: Atherosclerotic heart disease of native coronary artery without angina pectoris: I25.10

## 2016-03-22 HISTORY — DX: Acute myocardial infarction, unspecified: I21.9

## 2016-03-22 HISTORY — DX: Unspecified osteoarthritis, unspecified site: M19.90

## 2016-03-22 HISTORY — DX: Gastro-esophageal reflux disease without esophagitis: K21.9

## 2016-03-22 HISTORY — DX: Malignant (primary) neoplasm, unspecified: C80.1

## 2016-03-22 LAB — BASIC METABOLIC PANEL
ANION GAP: 7 (ref 5–15)
BUN: 14 mg/dL (ref 6–20)
CALCIUM: 9.3 mg/dL (ref 8.9–10.3)
CO2: 24 mmol/L (ref 22–32)
Chloride: 103 mmol/L (ref 101–111)
Creatinine, Ser: 0.85 mg/dL (ref 0.61–1.24)
GFR calc Af Amer: >60 mL/min (ref >60)
GLUCOSE: 98 mg/dL (ref 65–99)
POTASSIUM: 3.8 mmol/L (ref 3.5–5.1)
SODIUM: 134 mmol/L — AB (ref 135–145)

## 2016-03-22 LAB — URINALYSIS, ROUTINE W REFLEX MICROSCOPIC
BILIRUBIN URINE: NEGATIVE
GLUCOSE, UA: NEGATIVE mg/dL
HGB URINE DIPSTICK: NEGATIVE
KETONES UR: NEGATIVE mg/dL
Leukocytes, UA: NEGATIVE
Nitrite: NEGATIVE
PH: 5.5 (ref 5.0–8.0)
Protein, ur: NEGATIVE mg/dL
SPECIFIC GRAVITY, URINE: 1.016 (ref 1.005–1.030)

## 2016-03-22 LAB — CBC
HEMATOCRIT: 45.9 % (ref 39.0–52.0)
Hemoglobin: 15.5 g/dL (ref 13.0–17.0)
MCH: 32.2 pg (ref 26.0–34.0)
MCHC: 33.8 g/dL (ref 30.0–36.0)
MCV: 95.2 fL (ref 78.0–100.0)
Platelets: 259 10*3/uL (ref 150–400)
RBC: 4.82 MIL/uL (ref 4.22–5.81)
RDW: 12.6 % (ref 11.5–15.5)
WBC: 8.4 10*3/uL (ref 4.0–10.5)

## 2016-03-22 LAB — SURGICAL PCR SCREEN
MRSA, PCR: NEGATIVE
STAPHYLOCOCCUS AUREUS: NEGATIVE

## 2016-03-22 LAB — TYPE AND SCREEN
ABO/RH(D): O POS
ANTIBODY SCREEN: NEGATIVE

## 2016-03-22 LAB — ABO/RH: ABO/RH(D): O POS

## 2016-03-22 LAB — GLUCOSE, CAPILLARY: Glucose-Capillary: 106 mg/dL — ABNORMAL HIGH (ref 65–99)

## 2016-03-23 LAB — URINE CULTURE: Culture: NO GROWTH

## 2016-03-23 LAB — HEMOGLOBIN A1C
Hgb A1c MFr Bld: 5.8 % — ABNORMAL HIGH (ref 4.8–5.6)
MEAN PLASMA GLUCOSE: 120 mg/dL

## 2016-03-25 NOTE — Progress Notes (Signed)
Anesthesia Chart Review:  Pt is a 81 year old male scheduled for reverse R shoulder arthroplasty on 03/28/2016 with Meredith Pel, MD.   Cardiologist is Shirlee More, MD (care everywhere) who has cleared pt for surgery. PCP is Janace Litten, MD.   PMH includes:  CAD (s/p DES to LCx 2014), MI, HTN, DM, GERD. Former smoker. BMI 34  Medications include: ASA, carvedilol, plavix, metformin, protonix, valsartan-hctz. Pt stopped plavix 03/21/16.   Preoperative labs reviewed.    Chest x-ray 03/22/16 reviewed. Chronic bronchitic changes. No alveolar pneumonia nor CHF. Aortic atherosclerosis  EKG was requested from Dr. Joya Gaskins office but it appears they do not have one. Will obtain DOS.   Exercise stress echo 02/28/16:  - no evidence of ischemia - Normal stress echo - Normal exercise capacity  Cardiac cath 07/02/13 ([facility]):  1. Severe (80%) mid Cx lesion. S/p successful PCI/DES to Cx.  2. Mild disease elsewhere (30% RCA) 3. LV gram not done today.   If no changes, I anticipate pt can proceed with surgery as scheduled.   Willeen Cass, FNP-BC [facility]/Anesthesiology Phone: [phone] 03/25/2016 12:28 PM

## 2016-03-27 ENCOUNTER — Encounter (HOSPITAL_COMMUNITY): Payer: Self-pay | Admitting: Anesthesiology

## 2016-03-27 MED ORDER — CEFAZOLIN SODIUM-DEXTROSE 2-4 GM/100ML-% IV SOLN
2.0000 g | INTRAVENOUS | Status: AC
  Administered 2016-03-28 (×2): 2 g via INTRAVENOUS
  Filled 2016-03-27: qty 100

## 2016-03-28 ENCOUNTER — Ambulatory Visit (HOSPITAL_COMMUNITY): Payer: Medicare Other | Admitting: Anesthesiology

## 2016-03-28 ENCOUNTER — Ambulatory Visit (HOSPITAL_COMMUNITY): Payer: Medicare Other | Admitting: Emergency Medicine

## 2016-03-28 ENCOUNTER — Encounter (HOSPITAL_COMMUNITY): Payer: Self-pay | Admitting: *Deleted

## 2016-03-28 ENCOUNTER — Encounter (HOSPITAL_COMMUNITY)
Admission: RE | Disposition: A | Discharge status: Home / Self Care | Payer: Self-pay | Source: Ambulatory Visit | Attending: Orthopedic Surgery

## 2016-03-28 ENCOUNTER — Observation Stay (HOSPITAL_COMMUNITY)
Admission: RE | Admit: 2016-03-28 | Discharge: 2016-03-29 | Disposition: A | Discharge status: Home / Self Care | Payer: Medicare Other | Source: Ambulatory Visit | Attending: Orthopedic Surgery | Admitting: Orthopedic Surgery

## 2016-03-28 DIAGNOSIS — I252 Old myocardial infarction: Secondary | ICD-10-CM | POA: Insufficient documentation

## 2016-03-28 DIAGNOSIS — Z87891 Personal history of nicotine dependence: Secondary | ICD-10-CM | POA: Diagnosis not present

## 2016-03-28 DIAGNOSIS — I2510 Atherosclerotic heart disease of native coronary artery without angina pectoris: Secondary | ICD-10-CM | POA: Insufficient documentation

## 2016-03-28 DIAGNOSIS — Z7982 Long term (current) use of aspirin: Secondary | ICD-10-CM | POA: Insufficient documentation

## 2016-03-28 DIAGNOSIS — M13811 Other specified arthritis, right shoulder: Principal | ICD-10-CM | POA: Insufficient documentation

## 2016-03-28 DIAGNOSIS — Z7984 Long term (current) use of oral hypoglycemic drugs: Secondary | ICD-10-CM | POA: Insufficient documentation

## 2016-03-28 DIAGNOSIS — G8918 Other acute postprocedural pain: Secondary | ICD-10-CM | POA: Diagnosis not present

## 2016-03-28 DIAGNOSIS — Z7902 Long term (current) use of antithrombotics/antiplatelets: Secondary | ICD-10-CM | POA: Diagnosis not present

## 2016-03-28 DIAGNOSIS — K219 Gastro-esophageal reflux disease without esophagitis: Secondary | ICD-10-CM | POA: Insufficient documentation

## 2016-03-28 DIAGNOSIS — M12819 Other specific arthropathies, not elsewhere classified, unspecified shoulder: Secondary | ICD-10-CM | POA: Diagnosis present

## 2016-03-28 DIAGNOSIS — I10 Essential (primary) hypertension: Secondary | ICD-10-CM | POA: Diagnosis not present

## 2016-03-28 DIAGNOSIS — E119 Type 2 diabetes mellitus without complications: Secondary | ICD-10-CM | POA: Insufficient documentation

## 2016-03-28 DIAGNOSIS — Z79899 Other long term (current) drug therapy: Secondary | ICD-10-CM | POA: Insufficient documentation

## 2016-03-28 DIAGNOSIS — M19011 Primary osteoarthritis, right shoulder: Secondary | ICD-10-CM | POA: Diagnosis not present

## 2016-03-28 HISTORY — DX: Other specific arthropathies, not elsewhere classified, unspecified shoulder: M12.819

## 2016-03-28 HISTORY — PX: REVERSE SHOULDER ARTHROPLASTY: SHX5054

## 2016-03-28 LAB — GLUCOSE, CAPILLARY
GLUCOSE-CAPILLARY: 171 mg/dL — AB (ref 65–99)
Glucose-Capillary: 137 mg/dL — ABNORMAL HIGH (ref 65–99)
Glucose-Capillary: 142 mg/dL — ABNORMAL HIGH (ref 65–99)

## 2016-03-28 SURGERY — ARTHROPLASTY, SHOULDER, TOTAL, REVERSE
Anesthesia: General | Site: Shoulder | Laterality: Right

## 2016-03-28 MED ORDER — ACETAMINOPHEN 650 MG RE SUPP: 650.0000 mg | Freq: Four times a day (QID) | RECTAL | Status: DC | PRN

## 2016-03-28 MED ORDER — LACTATED RINGERS IV SOLN
INTRAVENOUS | Status: DC | PRN
  Administered 2016-03-28 (×3): via INTRAVENOUS

## 2016-03-28 MED ORDER — TRAZODONE HCL 50 MG PO TABS
150.0000 mg | ORAL_TABLET | Freq: Every day | ORAL | Status: DC
  Filled 2016-03-28: qty 1

## 2016-03-28 MED ORDER — CHLORHEXIDINE GLUCONATE 4 % EX LIQD: 60.0000 mL | Freq: Once | CUTANEOUS | Status: AC

## 2016-03-28 MED ORDER — 0.9 % SODIUM CHLORIDE (POUR BTL) OPTIME
TOPICAL | Status: DC | PRN
  Administered 2016-03-28 (×4): 1000 mL

## 2016-03-28 MED ORDER — ONDANSETRON HCL 4 MG PO TABS: 4.0000 mg | ORAL_TABLET | Freq: Four times a day (QID) | ORAL | Status: DC | PRN

## 2016-03-28 MED ORDER — ONDANSETRON HCL 4 MG/2ML IJ SOLN: 4.0000 mg | Freq: Four times a day (QID) | INTRAMUSCULAR | Status: DC | PRN

## 2016-03-28 MED ORDER — MENTHOL 3 MG MT LOZG: 1.0000 | LOZENGE | OROMUCOSAL | Status: DC | PRN

## 2016-03-28 MED ORDER — PROPOFOL 10 MG/ML IV BOLUS
INTRAVENOUS | Status: AC
  Filled 2016-03-28: qty 20

## 2016-03-28 MED ORDER — PROPOFOL 10 MG/ML IV BOLUS
INTRAVENOUS | Status: DC | PRN
  Administered 2016-03-28: 120 mg via INTRAVENOUS
  Administered 2016-03-28: 30 mg via INTRAVENOUS
  Administered 2016-03-28: 50 mg via INTRAVENOUS
  Administered 2016-03-28: 30 mg via INTRAVENOUS
  Administered 2016-03-28: 20 mg via INTRAVENOUS

## 2016-03-28 MED ORDER — GLYCOPYRROLATE 0.2 MG/ML IJ SOLN
INTRAMUSCULAR | Status: DC | PRN
  Administered 2016-03-28: 0.2 mg via INTRAVENOUS

## 2016-03-28 MED ORDER — ONDANSETRON HCL 4 MG/2ML IJ SOLN
INTRAMUSCULAR | Status: DC | PRN
Start: 2016-03-28 — End: 2016-03-28
  Administered 2016-03-28: 4 mg via INTRAVENOUS

## 2016-03-28 MED ORDER — PANTOPRAZOLE SODIUM 40 MG PO TBEC
40.0000 mg | DELAYED_RELEASE_TABLET | Freq: Every morning | ORAL | Status: DC
  Administered 2016-03-29: 40 mg via ORAL
  Filled 2016-03-28: qty 1

## 2016-03-28 MED ORDER — ACETAMINOPHEN 325 MG PO TABS: 650.0000 mg | ORAL_TABLET | Freq: Four times a day (QID) | ORAL | Status: DC | PRN

## 2016-03-28 MED ORDER — ADULT MULTIVITAMIN W/MINERALS CH
1.0000 | ORAL_TABLET | Freq: Every morning | ORAL | Status: DC
  Administered 2016-03-29: 1 via ORAL
  Filled 2016-03-28: qty 1

## 2016-03-28 MED ORDER — POTASSIUM CHLORIDE IN NACL 20-0.9 MEQ/L-% IV SOLN
INTRAVENOUS | Status: DC
  Administered 2016-03-28: 21:00:00 via INTRAVENOUS
  Filled 2016-03-28: qty 1000

## 2016-03-28 MED ORDER — HYDROCHLOROTHIAZIDE 12.5 MG PO CAPS
12.5000 mg | ORAL_CAPSULE | Freq: Every day | ORAL | Status: DC
  Administered 2016-03-29: 12.5 mg via ORAL
  Filled 2016-03-28: qty 1

## 2016-03-28 MED ORDER — EPHEDRINE SULFATE 50 MG/ML IJ SOLN
INTRAMUSCULAR | Status: DC | PRN
  Administered 2016-03-28: 10 mg via INTRAVENOUS
  Administered 2016-03-28: 5 mg via INTRAVENOUS

## 2016-03-28 MED ORDER — VALSARTAN-HYDROCHLOROTHIAZIDE 320-12.5 MG PO TABS: 1.0000 | ORAL_TABLET | ORAL | Status: DC

## 2016-03-28 MED ORDER — MELOXICAM 7.5 MG PO TABS
15.0000 mg | ORAL_TABLET | Freq: Every morning | ORAL | Status: DC
  Administered 2016-03-29: 15 mg via ORAL
  Filled 2016-03-28: qty 2

## 2016-03-28 MED ORDER — EPHEDRINE 5 MG/ML INJ
INTRAVENOUS | Status: AC
  Filled 2016-03-28: qty 10

## 2016-03-28 MED ORDER — PROPOFOL 10 MG/ML IV BOLUS
INTRAVENOUS | Status: AC
  Filled 2016-03-28: qty 40

## 2016-03-28 MED ORDER — NITROGLYCERIN 0.4 MG SL SUBL
0.4000 mg | SUBLINGUAL_TABLET | SUBLINGUAL | Status: DC | PRN
Start: 2016-03-28 — End: 2016-03-29

## 2016-03-28 MED ORDER — VANCOMYCIN HCL 1000 MG IV SOLR
INTRAVENOUS | Status: AC
  Filled 2016-03-28: qty 1000

## 2016-03-28 MED ORDER — CLOPIDOGREL BISULFATE 75 MG PO TABS
75.0000 mg | ORAL_TABLET | Freq: Every morning | ORAL | Status: DC
  Administered 2016-03-29: 75 mg via ORAL
  Filled 2016-03-28: qty 1

## 2016-03-28 MED ORDER — PHENOL 1.4 % MT LIQD: 1.0000 | OROMUCOSAL | Status: DC | PRN

## 2016-03-28 MED ORDER — IRBESARTAN 300 MG PO TABS
300.0000 mg | ORAL_TABLET | Freq: Every day | ORAL | Status: DC
  Administered 2016-03-29: 300 mg via ORAL
  Filled 2016-03-28: qty 1

## 2016-03-28 MED ORDER — LACTATED RINGERS IV SOLN
INTRAVENOUS | Status: DC
  Administered 2016-03-28: 08:00:00 via INTRAVENOUS

## 2016-03-28 MED ORDER — MIDAZOLAM HCL 2 MG/2ML IJ SOLN
INTRAMUSCULAR | Status: AC
  Administered 2016-03-28: 1 mg
  Filled 2016-03-28: qty 2

## 2016-03-28 MED ORDER — HYDROMORPHONE HCL 1 MG/ML IJ SOLN: 0.2500 mg | INTRAMUSCULAR | Status: DC | PRN

## 2016-03-28 MED ORDER — PROMETHAZINE HCL 25 MG/ML IJ SOLN: 6.2500 mg | INTRAMUSCULAR | Status: DC | PRN

## 2016-03-28 MED ORDER — METFORMIN HCL 500 MG PO TABS: 500.0000 mg | ORAL_TABLET | Freq: Every day | ORAL | Status: DC | PRN

## 2016-03-28 MED ORDER — MEPERIDINE HCL 25 MG/ML IJ SOLN: 6.2500 mg | INTRAMUSCULAR | Status: DC | PRN

## 2016-03-28 MED ORDER — BUPIVACAINE-EPINEPHRINE (PF) 0.5% -1:200000 IJ SOLN
INTRAMUSCULAR | Status: DC | PRN
  Administered 2016-03-28: 30 mL via PERINEURAL

## 2016-03-28 MED ORDER — ROCURONIUM BROMIDE 100 MG/10ML IV SOLN
INTRAVENOUS | Status: DC | PRN
  Administered 2016-03-28: 50 mg via INTRAVENOUS
  Administered 2016-03-28: 20 mg via INTRAVENOUS

## 2016-03-28 MED ORDER — PHENYLEPHRINE HCL 10 MG/ML IJ SOLN
10.0000 mg | INTRAVENOUS | Status: DC | PRN
  Administered 2016-03-28: 75 ug/min via INTRAVENOUS
  Administered 2016-03-28: 50 ug/min via INTRAVENOUS

## 2016-03-28 MED ORDER — HYDROCODONE-ACETAMINOPHEN 5-325 MG PO TABS
1.0000 | ORAL_TABLET | ORAL | Status: DC | PRN
  Administered 2016-03-28 – 2016-03-29 (×5): 2 via ORAL
  Filled 2016-03-28 (×5): qty 2

## 2016-03-28 MED ORDER — CEFAZOLIN SODIUM-DEXTROSE 2-4 GM/100ML-% IV SOLN
2.0000 g | INTRAVENOUS | Status: DC
  Filled 2016-03-28: qty 100

## 2016-03-28 MED ORDER — SUGAMMADEX SODIUM 500 MG/5ML IV SOLN
INTRAVENOUS | Status: DC | PRN
  Administered 2016-03-28: 198.6 mg via INTRAVENOUS

## 2016-03-28 MED ORDER — FENTANYL CITRATE (PF) 250 MCG/5ML IJ SOLN
INTRAMUSCULAR | Status: AC
  Filled 2016-03-28: qty 5

## 2016-03-28 MED ORDER — ASPIRIN EC 81 MG PO TBEC
81.0000 mg | DELAYED_RELEASE_TABLET | Freq: Every day | ORAL | Status: DC
  Administered 2016-03-28 – 2016-03-29 (×2): 81 mg via ORAL
  Filled 2016-03-28 (×2): qty 1

## 2016-03-28 MED ORDER — VITAMIN D3 25 MCG (1000 UNIT) PO TABS
2000.0000 [IU] | ORAL_TABLET | Freq: Every morning | ORAL | Status: DC
  Administered 2016-03-29: 2000 [IU] via ORAL
  Filled 2016-03-28 (×2): qty 2

## 2016-03-28 MED ORDER — CARVEDILOL 25 MG PO TABS
25.0000 mg | ORAL_TABLET | Freq: Two times a day (BID) | ORAL | Status: DC
  Administered 2016-03-29: 25 mg via ORAL
  Filled 2016-03-28 (×2): qty 1

## 2016-03-28 MED ORDER — VANCOMYCIN HCL 1000 MG IV SOLR
INTRAVENOUS | Status: DC | PRN
  Administered 2016-03-28: 1000 mg via TOPICAL

## 2016-03-28 MED ORDER — METOCLOPRAMIDE HCL 5 MG PO TABS: 5.0000 mg | ORAL_TABLET | Freq: Three times a day (TID) | ORAL | Status: DC | PRN

## 2016-03-28 MED ORDER — BUPIVACAINE-EPINEPHRINE (PF) 0.5% -1:200000 IJ SOLN
INTRAMUSCULAR | Status: AC
  Filled 2016-03-28: qty 30

## 2016-03-28 MED ORDER — INSULIN ASPART 100 UNIT/ML ~~LOC~~ SOLN
0.0000 [IU] | Freq: Three times a day (TID) | SUBCUTANEOUS | Status: DC
  Administered 2016-03-28: 2 [IU] via SUBCUTANEOUS

## 2016-03-28 MED ORDER — CEFAZOLIN SODIUM-DEXTROSE 2-4 GM/100ML-% IV SOLN
2.0000 g | Freq: Four times a day (QID) | INTRAVENOUS | Status: AC
  Administered 2016-03-28 – 2016-03-29 (×2): 2 g via INTRAVENOUS
  Filled 2016-03-28 (×2): qty 100

## 2016-03-28 MED ORDER — PHENYLEPHRINE HCL 10 MG/ML IJ SOLN
INTRAMUSCULAR | Status: DC | PRN
  Administered 2016-03-28 (×3): 80 ug via INTRAVENOUS
  Administered 2016-03-28: 120 ug via INTRAVENOUS
  Administered 2016-03-28: 40 ug via INTRAVENOUS
  Administered 2016-03-28: 80 ug via INTRAVENOUS

## 2016-03-28 MED ORDER — METOCLOPRAMIDE HCL 5 MG/ML IJ SOLN: 5.0000 mg | Freq: Three times a day (TID) | INTRAMUSCULAR | Status: DC | PRN

## 2016-03-28 MED ORDER — VITAMIN B-12 1000 MCG PO TABS
1000.0000 ug | ORAL_TABLET | Freq: Every morning | ORAL | Status: DC
  Administered 2016-03-29: 1000 ug via ORAL
  Filled 2016-03-28: qty 1

## 2016-03-28 MED ORDER — FENTANYL CITRATE (PF) 100 MCG/2ML IJ SOLN
INTRAMUSCULAR | Status: AC
  Administered 2016-03-28: 100 ug
  Filled 2016-03-28: qty 2

## 2016-03-28 MED ORDER — MIDAZOLAM HCL 2 MG/2ML IJ SOLN: 0.5000 mg | Freq: Once | INTRAMUSCULAR | Status: DC | PRN

## 2016-03-28 SURGICAL SUPPLY — 76 items
BASEPLATE GLENOID SHLDR SM (Shoulder) ×3 IMPLANT
BLADE SAW SGTL 13X75X1.27 (BLADE) ×3 IMPLANT
BUR MATCHSTICK NEURO 3.0 LAGG (BURR) IMPLANT
CLOSURE STERI-STRIP 1/2X4 (GAUZE/BANDAGES/DRESSINGS) ×1
CLSR STERI-STRIP ANTIMIC 1/2X4 (GAUZE/BANDAGES/DRESSINGS) ×2 IMPLANT
COVER SURGICAL LIGHT HANDLE (MISCELLANEOUS) ×3 IMPLANT
COVER TABLE BACK 60X90 (DRAPES) IMPLANT
CUP SUT UNIV REVERS 39 +2 (Shoulder) ×3 IMPLANT
DRAPE C-ARM 42X72 X-RAY (DRAPES) IMPLANT
DRAPE IMP U-DRAPE 54X76 (DRAPES) IMPLANT
DRAPE INCISE IOBAN 66X45 STRL (DRAPES) ×6 IMPLANT
DRAPE PROXIMA HALF (DRAPES) ×3 IMPLANT
DRAPE U-SHAPE 47X51 STRL (DRAPES) ×6 IMPLANT
DRSG AQUACEL AG ADV 3.5X10 (GAUZE/BANDAGES/DRESSINGS) ×3 IMPLANT
DURAPREP 26ML APPLICATOR (WOUND CARE) ×3 IMPLANT
ELECT BLADE 6.5 EXT (BLADE) IMPLANT
ELECT CAUTERY BLADE 6.4 (BLADE) ×3 IMPLANT
ELECT REM PT RETURN 9FT ADLT (ELECTROSURGICAL) ×3
ELECTRODE REM PT RTRN 9FT ADLT (ELECTROSURGICAL) ×1 IMPLANT
GLENOID VIP GUIDE MODEL 3D (SYSTAGENIX WOUND MANAGEMENT) ×3 IMPLANT
GLENOSPHERE LAT 39+4 SHOULDER (Shoulder) ×3 IMPLANT
GLOVE BIOGEL PI IND STRL 7.5 (GLOVE) ×1 IMPLANT
GLOVE BIOGEL PI IND STRL 8 (GLOVE) ×1 IMPLANT
GLOVE BIOGEL PI INDICATOR 7.5 (GLOVE) ×2
GLOVE BIOGEL PI INDICATOR 8 (GLOVE) ×2
GLOVE ECLIPSE 7.0 STRL STRAW (GLOVE) ×3 IMPLANT
GLOVE SURG ORTHO 8.0 STRL STRW (GLOVE) ×3 IMPLANT
GOWN STRL REUS W/ TWL LRG LVL3 (GOWN DISPOSABLE) ×2 IMPLANT
GOWN STRL REUS W/ TWL XL LVL3 (GOWN DISPOSABLE) ×1 IMPLANT
GOWN STRL REUS W/TWL LRG LVL3 (GOWN DISPOSABLE) ×4
GOWN STRL REUS W/TWL XL LVL3 (GOWN DISPOSABLE) ×2
INSERT HUMERAL 39/+6 (Insert) ×3 IMPLANT
INSERT HUMERAL MED 39/ +3 (Shoulder) ×1 IMPLANT
INSERT MEDIUM HUMERAL 39/ +3 (Shoulder) ×2 IMPLANT
KIT BASIN OR (CUSTOM PROCEDURE TRAY) ×3 IMPLANT
KIT ROOM TURNOVER OR (KITS) ×3 IMPLANT
LOOP VESSEL MAXI BLUE (MISCELLANEOUS) IMPLANT
MANIFOLD NEPTUNE II (INSTRUMENTS) ×3 IMPLANT
NDL SUT 6 .5 CRC .975X.05 MAYO (NEEDLE) ×1 IMPLANT
NEEDLE HYPO 25GX1X1/2 BEV (NEEDLE) IMPLANT
NEEDLE MAYO TAPER (NEEDLE) ×2
NS IRRIG 1000ML POUR BTL (IV SOLUTION) ×6 IMPLANT
PACK SHOULDER (CUSTOM PROCEDURE TRAY) ×3 IMPLANT
PAD ARMBOARD 7.5X6 YLW CONV (MISCELLANEOUS) ×3 IMPLANT
SCREW CENTRAL NONLOCK 6.5X20MM (Shoulder) ×3 IMPLANT
SCREW LOCK PERIPHERAL 30MM (Shoulder) ×3 IMPLANT
SCREW LOCK PERIPHERAL 36MM (Screw) ×3 IMPLANT
SET PIN UNIVERSAL REVERSE (SET/KITS/TRAYS/PACK) ×3 IMPLANT
SLING ARM FOAM STRAP LRG (SOFTGOODS) ×3 IMPLANT
SPACER REVERS TI 39/+9MM (Spacer) ×1 IMPLANT
SPACER SHLD UNI REV 39 +9 (Spacer) ×2 IMPLANT
SPONGE LAP 18X18 X RAY DECT (DISPOSABLE) ×3 IMPLANT
SPONGE LAP 4X18 X RAY DECT (DISPOSABLE) ×3 IMPLANT
STEM REVERSE SIZE8 (Stem) ×3 IMPLANT
SUCTION FRAZIER HANDLE 10FR (MISCELLANEOUS) ×2
SUCTION TUBE FRAZIER 10FR DISP (MISCELLANEOUS) ×1 IMPLANT
SUT FIBERWIRE #2 38 T-5 BLUE (SUTURE) ×6
SUT MAXBRAID (SUTURE) IMPLANT
SUT MNCRL AB 3-0 PS2 18 (SUTURE) ×3 IMPLANT
SUT VIC AB 0 CTB1 27 (SUTURE) ×6 IMPLANT
SUT VIC AB 1 CT1 27 (SUTURE) ×6
SUT VIC AB 1 CT1 27XBRD ANBCTR (SUTURE) ×3 IMPLANT
SUT VIC AB 2-0 CT1 27 (SUTURE) ×6
SUT VIC AB 2-0 CT1 TAPERPNT 27 (SUTURE) ×3 IMPLANT
SUT VICRYL 0 TIES 12 18 (SUTURE) ×3 IMPLANT
SUT VICRYL 0 UR6 27IN ABS (SUTURE) ×3 IMPLANT
SUTURE FIBERWR #2 38 T-5 BLUE (SUTURE) ×2 IMPLANT
SYR CONTROL 10ML LL (SYRINGE) IMPLANT
TOWEL OR 17X24 6PK STRL BLUE (TOWEL DISPOSABLE) ×3 IMPLANT
TOWEL OR 17X26 10 PK STRL BLUE (TOWEL DISPOSABLE) ×3 IMPLANT
TRAY CATH 16FR W/PLASTIC CATH (SET/KITS/TRAYS/PACK) ×3 IMPLANT
TRAY FOLEY BAG SILVER LF 16FR (CATHETERS) IMPLANT
TUBE CONNECTING 12'X1/4 (SUCTIONS) ×1
TUBE CONNECTING 12X1/4 (SUCTIONS) ×2 IMPLANT
WATER STERILE IRR 1000ML POUR (IV SOLUTION) IMPLANT
YANKAUER SUCT BULB TIP NO VENT (SUCTIONS) ×3 IMPLANT

## 2016-03-28 NOTE — Anesthesia Preprocedure Evaluation (Addendum)
Anesthesia Evaluation  Patient identified by MRN, date of birth, ID band Patient awake    Reviewed: Allergy & Precautions, NPO status , Patient's Chart, lab work & pertinent test results, reviewed documented beta blocker date and time   History of Anesthesia Complications Negative for: history of anesthetic complications  Airway Mallampati: II  TM Distance: >3 FB Neck ROM: Full    Dental  (+) Edentulous Lower, Edentulous Upper   Pulmonary COPD, former smoker (quit 1989),    breath sounds clear to auscultation       Cardiovascular hypertension, Pt. on medications and Pt. on home beta blockers (-) angina+ CAD and + Cardiac Stents   Rhythm:Regular Rate:Normal  Exercise stress echo 02/28/16:  - no evidence of ischemia - Normal stress echo - Normal exercise capacity   Neuro/Psych negative neurological ROS     GI/Hepatic Neg liver ROS, GERD  Medicated and Controlled,  Endo/Other  diabetes (glu 137), Oral Hypoglycemic AgentsMorbid obesity  Renal/GU negative Renal ROS     Musculoskeletal  (+) Arthritis , Osteoarthritis,    Abdominal (+) + obese,   Peds  Hematology   Anesthesia Other Findings   Reproductive/Obstetrics                           Anesthesia Physical Anesthesia Plan  ASA: III  Anesthesia Plan: General   Post-op Pain Management: GA combined w/ Regional for post-op pain   Induction: Intravenous  Airway Management Planned: Oral ETT  Additional Equipment:   Intra-op Plan:   Post-operative Plan: Extubation in OR  Informed Consent: I have reviewed the patients History and Physical, chart, labs and discussed the procedure including the risks, benefits and alternatives for the proposed anesthesia with the patient or authorized representative who has indicated his/her understanding and acceptance.     Plan Discussed with: CRNA and Surgeon  Anesthesia Plan Comments: (Plan  routine monitors, GETA with interscalene block for post op analgesia)        Anesthesia Quick Evaluation

## 2016-03-28 NOTE — Anesthesia Procedure Notes (Addendum)
Anesthesia Regional Block:  Interscalene brachial plexus block  Pre-Anesthetic Checklist: ,, timeout performed, Correct Patient, Correct Site, Correct Laterality, Correct Procedure, Correct Position, site marked, Risks and benefits discussed,  Surgical consent,  Pre-op evaluation,  At surgeon's request and post-op pain management  Laterality: Right and Upper  Prep: chloraprep       Needles:  Injection technique: Single-shot  Needle Type: Stimulator Needle - 40     Needle Length: 4cm 4 cm Needle Gauge: 22 and 22 G    Additional Needles:  Procedures: nerve stimulator Interscalene brachial plexus block  Nerve Stimulator or Paresthesia:  Response: forearm twitch, 0.45 mA, 0.1 ms,   Additional Responses:   Narrative:  Start time: 03/28/2016 9:31 AM End time: 03/28/2016 9:39 AM Injection made incrementally with aspirations every 5 mL.  Performed by: Personally  Anesthesiologist: Glennon Mac, C[place]  Additional Notes: Pt identified in Holding room.  Monitors applied. Working IV access confirmed. Sterile prep R neck.  #22ga PNS to forearm twitch at 0.61mA threshold.  30cc 0.5% Bupivacaine with 1:200k epi injected incrementally after negative test dose.  Patient asymptomatic, VSS, no heme aspirated, tolerated well.     Procedure Name: Intubation Date/Time: 03/28/2016 10:04 AM Performed by: Eligha Bridegroom Pre-anesthesia Checklist: Emergency Drugs available, Patient identified, Suction available, Patient being monitored and Timeout performed Patient Re-evaluated:Patient Re-evaluated prior to inductionOxygen Delivery Method: Circle system utilized Preoxygenation: Pre-oxygenation with 100% oxygen Intubation Type: IV induction Ventilation: Mask ventilation without difficulty and Oral airway inserted - appropriate to patient size Laryngoscope Size: Mac and 4 Grade View: Grade I Tube type: Oral Tube size: 7.5 mm Number of attempts: 1 Airway Equipment and Method: Stylet Placement  Confirmation: ETT inserted through vocal cords under direct vision,  positive ETCO2 and breath sounds checked- equal and bilateral Secured at: 23 cm Tube secured with: Tape Dental Injury: Teeth and Oropharynx as per pre-operative assessment

## 2016-03-28 NOTE — Anesthesia Postprocedure Evaluation (Signed)
Anesthesia Post Note  Patient: [name]  Procedure(s) Performed: Procedure(s) (LRB): REVERSE SHOULDER ARTHROPLASTY (Right)  Patient location during evaluation: PACU Anesthesia Type: General Level of consciousness: awake and alert Pain management: pain level controlled Vital Signs Assessment: post-procedure vital signs reviewed and stable Respiratory status: spontaneous breathing, nonlabored ventilation, respiratory function stable and patient connected to nasal cannula oxygen Cardiovascular status: blood pressure returned to baseline and stable Postop Assessment: no signs of nausea or vomiting Anesthetic complications: no    Last Vitals:  Filed Vitals:   03/28/16 1543 03/28/16 1615  BP: 116/74 119/75  Pulse: 77 74  Temp: 36.5 C   Resp: 15 14    Last Pain:  Filed Vitals:   03/28/16 1634  PainSc: 0-No pain                 Shakima Nisley DAVID

## 2016-03-28 NOTE — OR Nursing (Signed)
Sales representative had forgotten to charge for on item.  Added to chart by Audree Bane, RN at 7:13.

## 2016-03-28 NOTE — Transfer of Care (Signed)
Immediate Anesthesia Transfer of Care Note  Patient: Benjamin Andrade  Procedure(s) Performed: Procedure(s): REVERSE SHOULDER ARTHROPLASTY (Right)  Patient Location: PACU  Anesthesia Type:GA combined with regional for post-op pain  Level of Consciousness: awake, alert  and oriented  Airway & Oxygen Therapy: Patient Spontanous Breathing and Patient connected to nasal cannula oxygen  Post-op Assessment: Report given to RN and Post -op Vital signs reviewed and stable  Post vital signs: Reviewed and stable  Last Vitals:  Filed Vitals:   03/28/16 0704  BP: 122/72  Pulse: 69  Temp: 36.8 C  Resp: 18    Last Pain:  Filed Vitals:   03/28/16 1446  PainSc: 8       Patients Stated Pain Goal: 2 ([id number])  Complications: No apparent anesthesia complications

## 2016-03-28 NOTE — Brief Op Note (Signed)
03/28/2016  2:20 PM  PATIENT:  Benjamin Andrade  81 y.o. male  PRE-OPERATIVE DIAGNOSIS:  RIGHT SHOULDER ROTATOR CUFF ARTHROPATHY  POST-OPERATIVE DIAGNOSIS:  RIGHT SHOULDER ROTATOR CUFF ARTHROPATHY  PROCEDURE:  Procedure(s): REVERSE SHOULDER ARTHROPLASTY  SURGEON:  Surgeon(s): Meredith Pel, MD  ASSISTANT: Laure Kidney RNFA  ANESTHESIA:   general  EBL: 175 ml    Total I/O In: 1000 [I.V.:1000] Out: 175 [Blood:175]  BLOOD ADMINISTERED: none  DRAINS: none   LOCAL MEDICATIONS USED:  none  SPECIMEN:  No Specimen  COUNTS:  YES  TOURNIQUET:  * No tourniquets in log *  DICTATION: .Other Dictation: Dictation Number done  PLAN OF CARE: Admit for overnight observation  PATIENT DISPOSITION:  PACU - hemodynamically stable

## 2016-03-28 NOTE — H&P (Signed)
Benjamin Andrade is an 81 y.o. male.   Chief Complaint: Right shoulder pain HPI: Benjamin Andrade is a 81 year old patient with right shoulder pain.  He presents with weakness and inability to do some of the work-related projects around the house without significant pain and weakness in the right shoulder.  He has had prior rotator cuff repair.  MRI scan shows rotator cuff arthropathy and failure of the repair.  He denies any fevers and chills.  He has had preoperative cardiac risk stratification with appropriate medical management.  He presents now for reverse shoulder replacement.  Past Medical History  Diagnosis Date  . Coronary artery disease   . Myocardial infarction ([place])   . Dysrhythmia     occ  . Hypertension   . Diabetes mellitus without complication (HCC)     prediabetic  . GERD (gastroesophageal reflux disease)   . Arthritis   . Cancer ([place])     skin-lip-face    Past Surgical History  Procedure Laterality Date  . Back surgery    . Knee arthroscopy Right   . Carotid endarterectomy Left   . Shoulder arthroscopy w/ rotator cuff repair Right     History reviewed. No pertinent family history. Social History:  reports that he quit smoking about 28 years ago. His smoking use included Cigarettes. He has a 98 pack-year smoking history. He does not have any smokeless tobacco history on file. He reports that he does not drink alcohol or use illicit drugs.  Allergies:  Allergies  Allergen Reactions  . No Known Allergies     Reported from [facility] History    Medications Prior to Admission  Medication Sig Dispense Refill  . aspirin EC 81 MG tablet Take 81 mg by mouth daily.    . carvedilol (COREG) 25 MG tablet Take 25 mg by mouth 2 (two) times daily.    . Cholecalciferol (VITAMIN D3) 2000 units capsule Take 2,000 Units by mouth every morning.    . clopidogrel (PLAVIX) 75 MG tablet Take 75 mg by mouth every morning.     . meloxicam (MOBIC) 15 MG tablet Take 15 mg by mouth every morning.      . metFORMIN (GLUCOPHAGE) 500 MG tablet Take 500 mg by mouth daily as needed (Per sliding scale). Reported on 03/21/2016    . Multiple Vitamin (MULTI-VITAMINS) TABS Take 1 tablet by mouth every morning.     . nitroGLYCERIN (NITROSTAT) 0.4 MG SL tablet Place 0.4 mg under the tongue every 5 (five) minutes as needed for chest pain.     . pantoprazole (PROTONIX) 40 MG tablet Take 40 mg by mouth every morning.     . traZODone (DESYREL) 100 MG tablet Take 150 mg by mouth at bedtime.    . valsartan-hydrochlorothiazide (DIOVAN-HCT) 320-12.5 MG tablet Take 1 tablet by mouth every morning.     . vitamin B-12 (CYANOCOBALAMIN) 1000 MCG tablet Take 1,000 mcg by mouth every morning.      Results for orders placed or performed during the hospital encounter of 03/28/16 (from the past 48 hour(s))  Glucose, capillary     Status: Abnormal   Collection Time: 03/28/16  7:10 AM  Result Value Ref Range   Glucose-Capillary 137 (H) 65 - 99 mg/dL   No results found.  Review of Systems  Constitutional: Negative.   HENT: Negative.   Eyes: Negative.   Respiratory: Negative.   Cardiovascular: Negative.   Gastrointestinal: Negative.   Genitourinary: Negative.   Musculoskeletal: Positive for joint pain.  Skin: Negative.  Neurological: Negative.   Endo/Heme/Allergies: Negative.   Psychiatric/Behavioral: Negative.     Blood pressure 122/72, pulse 69, temperature 98.2 F (36.8 C), temperature source Oral, resp. rate 18, weight 99.338 kg (219 lb), SpO2 94 %. Physical Exam  Constitutional: He appears well-developed.  HENT:  Head: Normocephalic.  Eyes: Pupils are equal, round, and reactive to light.  Neck: Normal range of motion.  Cardiovascular: Normal rate.   Respiratory: Effort normal.  Neurological: He is alert.  Skin: Skin is warm.  Psychiatric: He has a normal mood and affect.   examination of the right shoulder demonstrates weakness with external rotation and supraspinatus testing.  Passive range of  motion is maintained.  Course grinding is present with passive range of motion motor sensory function to the hand is intact on the right radial pulses intact  Assessment/Plan Impression is right shoulder rotator cuff arthropathy plan right reverse shoulder replacement risks and benefits discussed with the patient including but not limited to infection or vessel damage instability incomplete pain relief as well as incomplete restoration of motion all questions answered  Meredith Pel, MD 03/28/2016, 9:36 AM

## 2016-03-28 NOTE — Op Note (Signed)
NAMEDEADRICK, VERDUGO NO.:  [id number]  MEDICAL RECORD NO.:  [id number]  LOCATION:  [id number]                        FACILITY:  [facility]  PHYSICIAN:  Anderson Malta, M.D.    DATE OF BIRTH:  Jan 18, 1935  DATE OF PROCEDURE: DATE OF DISCHARGE:                              OPERATIVE REPORT   PREOPERATIVE DIAGNOSIS:  Right rotator cuff arthropathy.  POSTOPERATIVE DIAGNOSIS:  Right rotator cuff arthropathy.  PROCEDURE:  Right shoulder reverse shoulder replacement using Arthrex components, 8 stem, 135 degrees, +15 composite liner, medium glenosphere and baseplate.  SURGEON:  Anderson Malta, MD  ASSISTANT:  Laure Kidney, RNFA.  INDICATIONS:  Benjamin Andrade is a 81 year old patient with right shoulder pain presents for operative management after explanation of risks and benefits.  PROCEDURE IN DETAIL:  The patient was brought to the operating room where general anesthetic was induced.  Preoperative antibiotics were administered.  Time-out was called.  The patient was placed in the beach- chair position with the head in neutral position, right arm in neutral position.  Right arm, shoulder, and hand were prescrubbed with alcohol and Betadine and allowed to air dry, prepped with DuraPrep solution, draped in a sterile manner.  Charlie Pitter was used to cover the entire operative field.  Time-out was called.  Incision was made over the coracoid process, extended proximally.  Skin and subcutaneous tissue were sharply divided.  Deltopectoral interval was identified.  The patient did not have a defined cephalic vein.  Crossing veins were ligated.  Deltopectoral interval was developed.  The subscap was torn. Supraspinatus and infraspinatus also torn.  Pack was partially released. Scoville retractor placed.  At this time, the ascending circumflex vessels were ligated.  The axillary nerve was identified at this time and then tagged with vessel loop.  This was protected at all times during the case.   Rotator interval was released.  At this time, the capsule was removed from the inferior aspect of the humerus around the 5 o'clock on the right shoulder.  This was taken down about 2 cm from the inferior neck.  At this time, the humeral head was cut in 30 degrees of retroversion.  This was done with oscillating saw with posterior structures protected.  Once this was done, the cap was placed. Attention was then directed towards the glenoid.  With my finger on top of the axillary nerve, the capsule was released circumferentially around the glenoid.  The anterior capsule was excised.  Labrum was excised peripherally.  Using the preoperatively templating guide, the guidepin was placed in the center portion of the glenoid.  Then, reaming and broaching were performed and the baseplate was placed with excellent fixation of two screws, placed superiorly and inferiorly.  At this time, the broaching was performed on the humerus.  This was broached up to size 8.  Cheese grater reaming was then performed approximately and at this time, the glenosphere was placed into the hemispherically-reamed proximal humerus and good fit was obtained.  Next, the broach was placed and the trial reduction was performed.  The patient had best reduction with no muscle relaxation with 15-mm spacer.  This was hard to dislocate and relocate.  The patient had about 2 mm of inferior shot, appropriate tension was noted on both the axillary nerve as well as the conjoint tendon.  Trial components were removed.  True components were placed after thorough irrigation.  Same stability parameters were maintained with forward flexion over 90 degrees, abduction over 90.  Tuberoplasty was performed to minimize impingement with internal-external rotation and 90 degrees of abduction.  Good reduction was achieved.  Thorough irrigation again performed.  Vancomycin powder was then placed intra- articular as well as on the skin surface.   Deltopectoral interval was closed using #1 Vicryl suture followed by interrupted inverted 0 Vicryl suture, 2-0 suture and a 3-0 Monocryl.  Aquacel dressing placed. Shoulder immobilizer placed.  The patient tolerated the procedure well without immediate complication, transferred to the recovery room in stable condition.     Anderson Malta, M.D.     GSD/MEDQ  D:  03/28/2016  T:  03/28/2016  Job:  [id number]

## 2016-03-29 DIAGNOSIS — M13811 Other specified arthritis, right shoulder: Secondary | ICD-10-CM | POA: Diagnosis not present

## 2016-03-29 LAB — GLUCOSE, CAPILLARY
GLUCOSE-CAPILLARY: 145 mg/dL — AB (ref 65–99)
Glucose-Capillary: 119 mg/dL — ABNORMAL HIGH (ref 65–99)

## 2016-03-29 MED ORDER — MELOXICAM 7.5 MG PO TABS: 7.5000 mg | ORAL_TABLET | ORAL | Status: DC

## 2016-03-29 MED ORDER — METHOCARBAMOL 500 MG PO TABS: 500.0000 mg | ORAL_TABLET | Freq: Three times a day (TID) | ORAL | Status: DC | PRN

## 2016-03-29 MED ORDER — HYDROCODONE-ACETAMINOPHEN 5-325 MG PO TABS: 1.0000 | ORAL_TABLET | ORAL | Status: DC | PRN

## 2016-03-29 NOTE — Progress Notes (Signed)
   03/29/16 1300  OT Visit Information  Last OT Received On 03/29/16  Assistance Needed +1  History of Present Illness pt was admitted for R reverse total shoulder replacement. PMH significant for back sx, CAD, MI, DM  Precautions  Precautions Shoulder;Fall  Type of Shoulder Precautions active protocol:  sling on all times except for bathing/dressing, elbow,wrist and finger AROM, shoulder A/PROM limited to FF 90; Abd 60 and ER 30  Shoulder Interventions Shoulder sling/immobilizer;Off for dressing/bathing/exercises  Precaution Booklet Issued Yes (comment)  Pain Assessment  Pain Assessment Faces  Faces Pain Scale 8  Pain Location R shoulder  Pain Descriptors / Indicators Aching  Pain Intervention(s) Limited activity within patient's tolerance;Monitored during session;Repositioned;Patient requesting pain meds-RN notified;Ice applied  Cognition  Arousal/Alertness Awake/alert  Behavior During Therapy WFL for tasks assessed/performed  Overall Cognitive Status Within Functional Limits for tasks assessed  ADL  General ADL Comments wife present. Reviewed protocol with both of them and educated on A/PROM limitations.  They have CPM at home and verbalize understanding of use.  Wife assisted with shirt and sling (min A) as well as LB adls.  Wife has a sore back but feels she can assist.  Performed UB adls and dressing this session.  Extended thumb loop with a piece of velfoam for comfort. Asked ortho tech to check for a smaller sling (he is in XL) but ortho tech felt this had a good fit  Bed Mobility  General bed mobility comments oob  Restrictions  Other Position/Activity Restrictions NWB  Transfers  Overall transfer level Modified independent  Exercises  Exercises (performed gentle A/PROM within limits:  FF to 30; abd to 30 and moved out of full IR about 30 degrees.)  OT - End of Session  Activity Tolerance Patient limited by pain  Patient left in chair;with call bell/phone within reach  OT  Assessment/Plan  Follow Up Recommendations (pt will follow up with Dr Marlou Sa)  OT Equipment None recommended by OT  OT Goal Progression  Progress towards OT goals Progressing toward goals (does not need further acute OT prior to dc)  OT Time Calculation  OT Start Time (ACUTE ONLY) 1135  OT Stop Time (ACUTE ONLY) 1202  OT Time Calculation (min) 27 min  OT G-codes **NOT FOR INPATIENT CLASS**  Self Care Discharge Status [id number]) CK  OT General Charges  $OT Visit 1 Procedure  OT Treatments  $Self Care/Home Management  23-37 mins  Lesle Chris, OTR/L [phone] 03/29/2016

## 2016-03-29 NOTE — Progress Notes (Signed)
Pt stable Right hand perfused and sensate and mobile Plan dc today after OT

## 2016-03-29 NOTE — Progress Notes (Signed)
Discharge instructions given. Pt verbalized understanding and all questions were answered.  

## 2016-03-29 NOTE — Care Management (Addendum)
Pt discharging home with self care. No order for [facility] services and no f/u recommended per OT. Pt was active with [facility] prior to admission and Stanton Kidney with [facility] will follow up with MD office to see if they want to continue services. CM spoke with Ruby Cola with [employer] and the patients CPM has been delivered to the home. No further needs per CM.

## 2016-03-29 NOTE — Discharge Summary (Signed)
Physician Discharge Summary  Patient ID: Benjamin Andrade MRN: [id number] DOB/AGE: [birth year] 79 y.o.  Admit date: 03/28/2016 Discharge date: 03/29/2016  Admission Diagnoses:  Active Problems:   Rotator cuff arthropathy   Discharge Diagnoses:  Same  Surgeries: Procedure(s): REVERSE SHOULDER ARTHROPLASTY on 03/28/2016   Consultants:    Discharged Condition: Stable  Hospital Course: Benjamin Andrade is an 81 y.o. male who was admitted 03/28/2016 with a chief complaint of right shoulder pain, and found to have a diagnosis of rotator cuff arthropathy.  They were brought to the operating room on 03/28/2016 and underwent the above named procedures.Did well and was dced to home after OT    Antibiotics given:  Anti-infectives    Start     Dose/Rate Route Frequency Ordered Stop   03/28/16 2000  ceFAZolin (ANCEF) IVPB 2g/100 mL premix     2 g 200 mL/hr over 30 Minutes Intravenous Every 6 hours 03/28/16 1609 03/29/16 0148   03/28/16 1415  ceFAZolin (ANCEF) IVPB 2g/100 mL premix  Status:  Discontinued     2 g 200 mL/hr over 30 Minutes Intravenous To Surgery 03/28/16 1403 03/28/16 1553   03/28/16 1346  vancomycin (VANCOCIN) powder  Status:  Discontinued       As needed 03/28/16 1346 03/28/16 1440   03/28/16 0930  ceFAZolin (ANCEF) IVPB 2g/100 mL premix     2 g 200 mL/hr over 30 Minutes Intravenous To ShortStay Surgical 03/27/16 0909 03/28/16 1415    .  Recent vital signs:  Filed Vitals:   03/29/16 0009 03/29/16 0357  BP: 100/56 111/60  Pulse: 85 85  Temp: 98.3 F (36.8 C) 98.1 F (36.7 C)  Resp: 18 17    Recent laboratory studies:  Results for orders placed or performed during the hospital encounter of 03/28/16  Glucose, capillary  Result Value Ref Range   Glucose-Capillary 137 (H) 65 - 99 mg/dL  Glucose, capillary  Result Value Ref Range   Glucose-Capillary 142 (H) 65 - 99 mg/dL   Comment 1 Notify RN   Glucose, capillary  Result Value Ref Range   Glucose-Capillary 171 (H) 65 -  99 mg/dL  Glucose, capillary  Result Value Ref Range   Glucose-Capillary 119 (H) 65 - 99 mg/dL    Discharge Medications:     Medication List    TAKE these medications        aspirin EC 81 MG tablet  Take 81 mg by mouth daily.     carvedilol 25 MG tablet  Commonly known as:  COREG  Take 25 mg by mouth 2 (two) times daily.     clopidogrel 75 MG tablet  Commonly known as:  PLAVIX  Take 75 mg by mouth every morning.     HYDROcodone-acetaminophen 5-325 MG tablet  Commonly known as:  NORCO/VICODIN  Take 1-2 tablets by mouth every 4 (four) hours as needed (breakthrough pain).     meloxicam 7.5 MG tablet  Commonly known as:  MOBIC  Take 1 tablet (7.5 mg total) by mouth every morning.     metFORMIN 500 MG tablet  Commonly known as:  GLUCOPHAGE  Take 500 mg by mouth daily as needed (Per sliding scale). Reported on 03/21/2016     methocarbamol 500 MG tablet  Commonly known as:  ROBAXIN  Take 1 tablet (500 mg total) by mouth every 8 (eight) hours as needed for muscle spasms.     MULTI-VITAMINS Tabs  Take 1 tablet by mouth every morning.     nitroGLYCERIN 0.4 MG SL  tablet  Commonly known as:  NITROSTAT  Place 0.4 mg under the tongue every 5 (five) minutes as needed for chest pain.     pantoprazole 40 MG tablet  Commonly known as:  PROTONIX  Take 40 mg by mouth every morning.     traZODone 100 MG tablet  Commonly known as:  DESYREL  Take 150 mg by mouth at bedtime.     valsartan-hydrochlorothiazide 320-12.5 MG tablet  Commonly known as:  DIOVAN-HCT  Take 1 tablet by mouth every morning.     vitamin B-12 1000 MCG tablet  Commonly known as:  CYANOCOBALAMIN  Take 1,000 mcg by mouth every morning.     Vitamin D3 2000 units capsule  Take 2,000 Units by mouth every morning.        Diagnostic Studies: Dg Chest 2 View  03/22/2016  CLINICAL DATA:  Preoperative exam prior to shoulder surgery; history of previous MI, diabetes, former heavy smoker discontinued 28 years ago.  EXAM: CHEST  2 VIEW COMPARISON:  Frontal chest x-ray of July 01, 2013 FINDINGS: The lungs are adequately inflated. The interstitial markings are coarse. There is scarring at both lung bases. There is no alveolar infiltrate. There is no pleural effusion. There is old deformity of the lateral aspect of the left 6th rib. The heart and pulmonary vascularity are normal. There is calcification in the wall of the aortic arch. There is multilevel degenerative disc disease of the thoracic spine. IMPRESSION: Chronic bronchitic changes. No alveolar pneumonia nor CHF. Aortic atherosclerosis. Electronically Signed   By: David  Martinique M.D.   On: 03/22/2016 13:07    Disposition: Final discharge disposition not confirmed      Discharge Instructions    Call MD / Call 911    Complete by:  As directed   If you experience chest pain or shortness of breath, CALL 911 and be transported to the hospital emergency room.  If you develope a fever above 101 F, pus (white drainage) or increased drainage or redness at the wound, or calf pain, call your surgeon's office.     Constipation Prevention    Complete by:  As directed   Drink plenty of fluids.  Prune juice may be helpful.  You may use a stool softener, such as Colace (over the counter) 100 mg twice a day.  Use MiraLax (over the counter) for constipation as needed.     Diet - low sodium heart healthy    Complete by:  As directed      Discharge instructions    Complete by:  As directed   CPM 1 hour 3 times a day Use sling for comfort No lifting more than 5 lbs Ok to shower dressing waterproof     Increase activity slowly as tolerated    Complete by:  As directed               Signed: DEAN,GREGORY SCOTT 03/29/2016, 8:19 AM

## 2016-03-29 NOTE — Evaluation (Signed)
Occupational Therapy Evaluation Patient Details Name: Benjamin Andrade MRN: [id number] DOB: [birth year] Today's Date: 03/29/2016    History of Present Illness pt was admitted for R reverse total shoulder replacement. PMH significant for back sx, CAD, MI, DM   Clinical Impression   This 81 year old man was admitted for the above sx.  He will benefit from one more session in acute setting to review shoulder education and to ensure that wife feels comfortable with assisting him at home.      Follow Up Recommendations   (pt will follow up with Dr Marlou Sa)    Equipment Recommendations  None recommended by OT    Recommendations for Other Services       Precautions / Restrictions Precautions Precautions: Shoulder;Fall Type of Shoulder Precautions: active protocol:  sling on all times except for bathing/dressing, elbow,wrist and finger AROM, shoulder A/PROM limited to FF 90; Abd 60 and ER 30 Shoulder Interventions: Shoulder sling/immobilizer;Off for dressing/bathing/exercises Precaution Booklet Issued: Yes (comment) Restrictions Weight Bearing Restrictions: Yes Other Position/Activity Restrictions: NWB      Mobility Bed Mobility               General bed mobility comments: oob  Transfers                 General transfer comment: NT    Balance                                            ADL Overall ADL's : Needs assistance/impaired     Grooming: Set up;Sitting   Upper Body Bathing: Minimal assitance;Sitting   Lower Body Bathing: Moderate assistance;Sit to/from stand   Upper Body Dressing : Moderate assistance   Lower Body Dressing: Moderate assistance;Sit to/from stand                 General ADL Comments: did not perform a transfer at this time.  Pt was up in chair     Vision     Perception     Praxis      Pertinent Vitals/Pain Pain Assessment: Faces Faces Pain Scale: Hurts little more Pain Location: R shoulder Pain  Descriptors / Indicators: Sore Pain Intervention(s): Limited activity within patient's tolerance;Monitored during session;Premedicated before session;Repositioned     Hand Dominance Right   Extremity/Trunk Assessment Upper Extremity Assessment Upper Extremity Assessment: RUE deficits/detail RUE Deficits / Details: AAROM performed (to tolerance) within given parameters:  FF approximately 60; abduction 40 and rotation, subneutral  approximately -30 from neutral           Communication Communication Communication: No difficulties   Cognition Arousal/Alertness: Awake/alert Behavior During Therapy: WFL for tasks assessed/performed Overall Cognitive Status: Within Functional Limits for tasks assessed                     General Comments       Exercises       Shoulder Instructions  see education section of chart:  Issued shoulder protocol    Home Living Family/patient expects to be discharged to:: Private residence Living Arrangements: Spouse/significant other                 Bathroom Shower/Tub: Occupational psychologist: Handicapped height                Prior Functioning/Environment Level of Independence: Independent  OT Diagnosis: Acute pain   OT Problem List: Decreased strength;Pain;Impaired UE functional use;Decreased knowledge of precautions   OT Treatment/Interventions: Self-care/ADL training;DME and/or AE instruction;Patient/family education    OT Goals(Current goals can be found in the care plan section) Acute Rehab OT Goals Patient Stated Goal: home ASAP OT Goal Formulation: With patient Time For Goal Achievement: 04/05/16 Potential to Achieve Goals: Good ADL Goals Additional ADL Goal #1: wife will assist with UB adls and donning sling with supervision Additional ADL Goal #2: pt/wife will verbalize all education for active shoulder protocol  OT Frequency: Min 2X/week   Barriers to D/C:             Co-evaluation              End of Session    Activity Tolerance: Patient tolerated treatment well Patient left: in chair;with call bell/phone within reach   Time: 0905-0927 OT Time Calculation (min): 22 min Charges:  OT General Charges $OT Visit: 1 Procedure OT Evaluation $OT Eval Moderate Complexity: 1 Procedure G-Codes: OT G-codes **NOT FOR INPATIENT CLASS** Functional Assessment Tool Used: clinical judgment and observation Functional Limitation: Self care Self Care Current Status [id number]): At least 40 percent but less than 60 percent impaired, limited or restricted Self Care Goal Status [id number]): At least 40 percent but less than 60 percent impaired, limited or restricted Self Care Discharge Status [phone]): At least 40 percent but less than 60 percent impaired, limited or restricted  [facility] 03/29/2016, 11:23 AM Lesle Chris, OTR/L [phone] 03/29/2016

## 2016-03-30 DIAGNOSIS — E119 Type 2 diabetes mellitus without complications: Secondary | ICD-10-CM | POA: Diagnosis not present

## 2016-03-30 DIAGNOSIS — Z6834 Body mass index (BMI) 34.0-34.9, adult: Secondary | ICD-10-CM | POA: Diagnosis not present

## 2016-03-30 DIAGNOSIS — Z96611 Presence of right artificial shoulder joint: Secondary | ICD-10-CM | POA: Diagnosis not present

## 2016-03-30 DIAGNOSIS — I10 Essential (primary) hypertension: Secondary | ICD-10-CM | POA: Diagnosis not present

## 2016-03-30 DIAGNOSIS — Z471 Aftercare following joint replacement surgery: Secondary | ICD-10-CM | POA: Diagnosis not present

## 2016-03-30 DIAGNOSIS — E669 Obesity, unspecified: Secondary | ICD-10-CM | POA: Diagnosis not present

## 2016-04-01 ENCOUNTER — Encounter (HOSPITAL_COMMUNITY): Payer: Self-pay | Admitting: Orthopedic Surgery

## 2016-04-02 DIAGNOSIS — Z6834 Body mass index (BMI) 34.0-34.9, adult: Secondary | ICD-10-CM | POA: Diagnosis not present

## 2016-04-02 DIAGNOSIS — I10 Essential (primary) hypertension: Secondary | ICD-10-CM | POA: Diagnosis not present

## 2016-04-02 DIAGNOSIS — Z471 Aftercare following joint replacement surgery: Secondary | ICD-10-CM | POA: Diagnosis not present

## 2016-04-02 DIAGNOSIS — E669 Obesity, unspecified: Secondary | ICD-10-CM | POA: Diagnosis not present

## 2016-04-02 DIAGNOSIS — E119 Type 2 diabetes mellitus without complications: Secondary | ICD-10-CM | POA: Diagnosis not present

## 2016-04-02 DIAGNOSIS — Z96611 Presence of right artificial shoulder joint: Secondary | ICD-10-CM | POA: Diagnosis not present

## 2016-04-05 DIAGNOSIS — Z471 Aftercare following joint replacement surgery: Secondary | ICD-10-CM | POA: Diagnosis not present

## 2016-04-05 DIAGNOSIS — Z96611 Presence of right artificial shoulder joint: Secondary | ICD-10-CM | POA: Diagnosis not present

## 2016-04-05 DIAGNOSIS — E669 Obesity, unspecified: Secondary | ICD-10-CM | POA: Diagnosis not present

## 2016-04-05 DIAGNOSIS — I10 Essential (primary) hypertension: Secondary | ICD-10-CM | POA: Diagnosis not present

## 2016-04-05 DIAGNOSIS — Z6834 Body mass index (BMI) 34.0-34.9, adult: Secondary | ICD-10-CM | POA: Diagnosis not present

## 2016-04-05 DIAGNOSIS — E119 Type 2 diabetes mellitus without complications: Secondary | ICD-10-CM | POA: Diagnosis not present

## 2016-04-09 DIAGNOSIS — Z471 Aftercare following joint replacement surgery: Secondary | ICD-10-CM | POA: Diagnosis not present

## 2016-04-09 DIAGNOSIS — Z96611 Presence of right artificial shoulder joint: Secondary | ICD-10-CM | POA: Diagnosis not present

## 2016-04-09 DIAGNOSIS — E119 Type 2 diabetes mellitus without complications: Secondary | ICD-10-CM | POA: Diagnosis not present

## 2016-04-09 DIAGNOSIS — Z6834 Body mass index (BMI) 34.0-34.9, adult: Secondary | ICD-10-CM | POA: Diagnosis not present

## 2016-04-09 DIAGNOSIS — I10 Essential (primary) hypertension: Secondary | ICD-10-CM | POA: Diagnosis not present

## 2016-04-09 DIAGNOSIS — E669 Obesity, unspecified: Secondary | ICD-10-CM | POA: Diagnosis not present

## 2016-04-10 DIAGNOSIS — Z96611 Presence of right artificial shoulder joint: Secondary | ICD-10-CM | POA: Diagnosis not present

## 2016-04-11 DIAGNOSIS — E119 Type 2 diabetes mellitus without complications: Secondary | ICD-10-CM | POA: Diagnosis not present

## 2016-04-11 DIAGNOSIS — Z96611 Presence of right artificial shoulder joint: Secondary | ICD-10-CM | POA: Diagnosis not present

## 2016-04-11 DIAGNOSIS — I10 Essential (primary) hypertension: Secondary | ICD-10-CM | POA: Diagnosis not present

## 2016-04-11 DIAGNOSIS — Z471 Aftercare following joint replacement surgery: Secondary | ICD-10-CM | POA: Diagnosis not present

## 2016-04-11 DIAGNOSIS — Z6834 Body mass index (BMI) 34.0-34.9, adult: Secondary | ICD-10-CM | POA: Diagnosis not present

## 2016-04-11 DIAGNOSIS — E669 Obesity, unspecified: Secondary | ICD-10-CM | POA: Diagnosis not present

## 2016-04-12 DIAGNOSIS — Z6834 Body mass index (BMI) 34.0-34.9, adult: Secondary | ICD-10-CM | POA: Diagnosis not present

## 2016-04-12 DIAGNOSIS — I10 Essential (primary) hypertension: Secondary | ICD-10-CM | POA: Diagnosis not present

## 2016-04-12 DIAGNOSIS — E119 Type 2 diabetes mellitus without complications: Secondary | ICD-10-CM | POA: Diagnosis not present

## 2016-04-12 DIAGNOSIS — Z471 Aftercare following joint replacement surgery: Secondary | ICD-10-CM | POA: Diagnosis not present

## 2016-04-12 DIAGNOSIS — Z96611 Presence of right artificial shoulder joint: Secondary | ICD-10-CM | POA: Diagnosis not present

## 2016-04-12 DIAGNOSIS — E669 Obesity, unspecified: Secondary | ICD-10-CM | POA: Diagnosis not present

## 2016-04-16 DIAGNOSIS — M25511 Pain in right shoulder: Secondary | ICD-10-CM | POA: Diagnosis not present

## 2016-04-16 DIAGNOSIS — Z96611 Presence of right artificial shoulder joint: Secondary | ICD-10-CM | POA: Diagnosis not present

## 2016-04-16 DIAGNOSIS — M19011 Primary osteoarthritis, right shoulder: Secondary | ICD-10-CM | POA: Diagnosis not present

## 2016-04-16 DIAGNOSIS — M6281 Muscle weakness (generalized): Secondary | ICD-10-CM | POA: Diagnosis not present

## 2016-04-16 DIAGNOSIS — M25611 Stiffness of right shoulder, not elsewhere classified: Secondary | ICD-10-CM | POA: Diagnosis not present

## 2016-04-16 DIAGNOSIS — M25621 Stiffness of right elbow, not elsewhere classified: Secondary | ICD-10-CM | POA: Diagnosis not present

## 2016-04-16 DIAGNOSIS — R293 Abnormal posture: Secondary | ICD-10-CM | POA: Diagnosis not present

## 2016-04-19 DIAGNOSIS — M6281 Muscle weakness (generalized): Secondary | ICD-10-CM | POA: Diagnosis not present

## 2016-04-19 DIAGNOSIS — M25611 Stiffness of right shoulder, not elsewhere classified: Secondary | ICD-10-CM | POA: Diagnosis not present

## 2016-04-19 DIAGNOSIS — M25511 Pain in right shoulder: Secondary | ICD-10-CM | POA: Diagnosis not present

## 2016-04-19 DIAGNOSIS — M19011 Primary osteoarthritis, right shoulder: Secondary | ICD-10-CM | POA: Diagnosis not present

## 2016-04-19 DIAGNOSIS — M25621 Stiffness of right elbow, not elsewhere classified: Secondary | ICD-10-CM | POA: Diagnosis not present

## 2016-04-19 DIAGNOSIS — Z96611 Presence of right artificial shoulder joint: Secondary | ICD-10-CM | POA: Diagnosis not present

## 2016-04-23 DIAGNOSIS — M6281 Muscle weakness (generalized): Secondary | ICD-10-CM | POA: Diagnosis not present

## 2016-04-23 DIAGNOSIS — M25611 Stiffness of right shoulder, not elsewhere classified: Secondary | ICD-10-CM | POA: Diagnosis not present

## 2016-04-23 DIAGNOSIS — M25621 Stiffness of right elbow, not elsewhere classified: Secondary | ICD-10-CM | POA: Diagnosis not present

## 2016-04-23 DIAGNOSIS — Z96611 Presence of right artificial shoulder joint: Secondary | ICD-10-CM | POA: Diagnosis not present

## 2016-04-23 DIAGNOSIS — M25511 Pain in right shoulder: Secondary | ICD-10-CM | POA: Diagnosis not present

## 2016-04-23 DIAGNOSIS — M19011 Primary osteoarthritis, right shoulder: Secondary | ICD-10-CM | POA: Diagnosis not present

## 2016-04-25 DIAGNOSIS — Z96611 Presence of right artificial shoulder joint: Secondary | ICD-10-CM | POA: Diagnosis not present

## 2016-04-25 DIAGNOSIS — M25511 Pain in right shoulder: Secondary | ICD-10-CM | POA: Diagnosis not present

## 2016-04-25 DIAGNOSIS — M25611 Stiffness of right shoulder, not elsewhere classified: Secondary | ICD-10-CM | POA: Diagnosis not present

## 2016-04-25 DIAGNOSIS — M19011 Primary osteoarthritis, right shoulder: Secondary | ICD-10-CM | POA: Diagnosis not present

## 2016-04-25 DIAGNOSIS — M6281 Muscle weakness (generalized): Secondary | ICD-10-CM | POA: Diagnosis not present

## 2016-04-25 DIAGNOSIS — M25621 Stiffness of right elbow, not elsewhere classified: Secondary | ICD-10-CM | POA: Diagnosis not present

## 2016-05-02 DIAGNOSIS — M6281 Muscle weakness (generalized): Secondary | ICD-10-CM | POA: Diagnosis not present

## 2016-05-02 DIAGNOSIS — M25611 Stiffness of right shoulder, not elsewhere classified: Secondary | ICD-10-CM | POA: Diagnosis not present

## 2016-05-02 DIAGNOSIS — Z96611 Presence of right artificial shoulder joint: Secondary | ICD-10-CM | POA: Diagnosis not present

## 2016-05-02 DIAGNOSIS — M19011 Primary osteoarthritis, right shoulder: Secondary | ICD-10-CM | POA: Diagnosis not present

## 2016-05-02 DIAGNOSIS — M25511 Pain in right shoulder: Secondary | ICD-10-CM | POA: Diagnosis not present

## 2016-05-02 DIAGNOSIS — M25621 Stiffness of right elbow, not elsewhere classified: Secondary | ICD-10-CM | POA: Diagnosis not present

## 2016-05-07 DIAGNOSIS — M25511 Pain in right shoulder: Secondary | ICD-10-CM | POA: Diagnosis not present

## 2016-05-07 DIAGNOSIS — M19011 Primary osteoarthritis, right shoulder: Secondary | ICD-10-CM | POA: Diagnosis not present

## 2016-05-07 DIAGNOSIS — M25621 Stiffness of right elbow, not elsewhere classified: Secondary | ICD-10-CM | POA: Diagnosis not present

## 2016-05-07 DIAGNOSIS — M6281 Muscle weakness (generalized): Secondary | ICD-10-CM | POA: Diagnosis not present

## 2016-05-07 DIAGNOSIS — Z96611 Presence of right artificial shoulder joint: Secondary | ICD-10-CM | POA: Diagnosis not present

## 2016-05-07 DIAGNOSIS — M25611 Stiffness of right shoulder, not elsewhere classified: Secondary | ICD-10-CM | POA: Diagnosis not present

## 2016-05-08 DIAGNOSIS — E782 Mixed hyperlipidemia: Secondary | ICD-10-CM | POA: Diagnosis not present

## 2016-05-08 DIAGNOSIS — F0390 Unspecified dementia without behavioral disturbance: Secondary | ICD-10-CM | POA: Diagnosis not present

## 2016-05-08 DIAGNOSIS — N401 Enlarged prostate with lower urinary tract symptoms: Secondary | ICD-10-CM | POA: Diagnosis not present

## 2016-05-08 DIAGNOSIS — I10 Essential (primary) hypertension: Secondary | ICD-10-CM | POA: Diagnosis not present

## 2016-05-08 DIAGNOSIS — Z0000 Encounter for general adult medical examination without abnormal findings: Secondary | ICD-10-CM | POA: Diagnosis not present

## 2016-05-08 DIAGNOSIS — Z79899 Other long term (current) drug therapy: Secondary | ICD-10-CM | POA: Diagnosis not present

## 2016-05-08 DIAGNOSIS — Z125 Encounter for screening for malignant neoplasm of prostate: Secondary | ICD-10-CM | POA: Diagnosis not present

## 2016-05-08 DIAGNOSIS — H9193 Unspecified hearing loss, bilateral: Secondary | ICD-10-CM | POA: Diagnosis not present

## 2016-05-09 DIAGNOSIS — E7800 Pure hypercholesterolemia, unspecified: Secondary | ICD-10-CM | POA: Diagnosis not present

## 2016-05-09 DIAGNOSIS — I6523 Occlusion and stenosis of bilateral carotid arteries: Secondary | ICD-10-CM | POA: Diagnosis not present

## 2016-05-09 DIAGNOSIS — I10 Essential (primary) hypertension: Secondary | ICD-10-CM | POA: Diagnosis not present

## 2016-05-09 HISTORY — DX: Occlusion and stenosis of bilateral carotid arteries: I65.23

## 2016-05-21 DIAGNOSIS — Z96611 Presence of right artificial shoulder joint: Secondary | ICD-10-CM | POA: Diagnosis not present

## 2016-05-24 DIAGNOSIS — I10 Essential (primary) hypertension: Secondary | ICD-10-CM | POA: Diagnosis not present

## 2016-05-24 DIAGNOSIS — H903 Sensorineural hearing loss, bilateral: Secondary | ICD-10-CM | POA: Diagnosis not present

## 2016-07-03 DIAGNOSIS — K635 Polyp of colon: Secondary | ICD-10-CM | POA: Diagnosis not present

## 2016-07-03 DIAGNOSIS — Z86010 Personal history of colonic polyps: Secondary | ICD-10-CM | POA: Diagnosis not present

## 2016-07-03 DIAGNOSIS — Z1211 Encounter for screening for malignant neoplasm of colon: Secondary | ICD-10-CM | POA: Diagnosis not present

## 2016-07-03 DIAGNOSIS — D125 Benign neoplasm of sigmoid colon: Secondary | ICD-10-CM | POA: Diagnosis not present

## 2016-07-03 DIAGNOSIS — K5730 Diverticulosis of large intestine without perforation or abscess without bleeding: Secondary | ICD-10-CM | POA: Diagnosis not present

## 2016-07-18 DIAGNOSIS — L219 Seborrheic dermatitis, unspecified: Secondary | ICD-10-CM | POA: Diagnosis not present

## 2016-07-18 DIAGNOSIS — L821 Other seborrheic keratosis: Secondary | ICD-10-CM | POA: Diagnosis not present

## 2016-07-18 DIAGNOSIS — L814 Other melanin hyperpigmentation: Secondary | ICD-10-CM | POA: Diagnosis not present

## 2016-07-24 ENCOUNTER — Encounter (INDEPENDENT_AMBULATORY_CARE_PROVIDER_SITE_OTHER): Payer: Self-pay | Admitting: Orthopedic Surgery

## 2016-07-24 ENCOUNTER — Ambulatory Visit (INDEPENDENT_AMBULATORY_CARE_PROVIDER_SITE_OTHER): Payer: Medicare Other | Admitting: Orthopedic Surgery

## 2016-07-24 DIAGNOSIS — Z96611 Presence of right artificial shoulder joint: Secondary | ICD-10-CM | POA: Diagnosis not present

## 2016-07-24 NOTE — Progress Notes (Signed)
Office Visit Note   Patient: Benjamin Andrade           Date of Birth: 08-10-1935           MRN: [id number] Visit Date: 07/24/2016 Requested by: Myrlene Broker, MD [place], [place] [address] PCP: Myrlene Broker, MD  Subjective: Chief Complaint  Patient presents with  . Right Shoulder - Routine Post Op    HPI Benjamin Andrade is a 81 year old patient is now 17 weeks out from reverse shoulder replacement.  He's been doing well.  No real limitations.  Occasionally has a little bit of trouble getting his wallet out of his back pocket on the right-hand side but otherwise is doing fine.  He was having little catching when I saw him several months ago and now that has resolved.              Review of Systems All systems reviewed are negative as they relate to the chief complaint within the history of present illness.  Patient denies  fevers or chills.    Assessment & Plan: Visit Diagnoses:  1. History of right shoulder replacement     Plan: Patient's doing well with his shoulder replacement.  The preop pain that he had before surgery has now resolved.  He has good function.  Need for antibody prophylaxis before dental procedures discussed.  I will see him back as needed  Follow-Up Instructions: No Follow-up on file.   Orders:  No orders of the defined types were placed in this encounter.  No orders of the defined types were placed in this encounter.     Procedures: No procedures performed   Clinical Data: No additional findings.  Objective: Vital Signs: There were no vitals taken for this visit.  Physical Exam  Constitutional: He appears well-developed.  HENT:  Head: Normocephalic.  Eyes: EOM are normal.  Neck: Normal range of motion.  Cardiovascular: Normal rate.   Pulmonary/Chest: Effort normal.  Neurological: He is alert.  Skin: Skin is warm.  Psychiatric: He has a normal mood and affect.    Ortho Exam examination the right shoulder demonstrates forward  flexion to about 140 isolated glenohumeral abduction to over 90 Strength is good to interspace stress subscap muscle testing.  This is despite the fact that he has a reverse shoulder replacement and doesn't really have functional rotator cuff muscles.  Nonetheless his motion and strength in these planes is very good.  Specialty Comments:  No specialty comments available.  Imaging: No results found.   PMFS History: Patient Active Problem List   Diagnosis Date Noted  . Rotator cuff arthropathy 03/28/2016   Past Medical History:  Diagnosis Date  . Arthritis   . Cancer ([place])    skin-lip-face  . Coronary artery disease   . Diabetes mellitus without complication (HCC)    prediabetic  . Dysrhythmia    occ  . GERD (gastroesophageal reflux disease)   . Hypertension   . Myocardial infarction     No family history on file.  Past Surgical History:  Procedure Laterality Date  . BACK SURGERY    . CAROTID ENDARTERECTOMY Left   . KNEE ARTHROSCOPY Right   . REVERSE SHOULDER ARTHROPLASTY Right 03/28/2016   Procedure: REVERSE SHOULDER ARTHROPLASTY;  Surgeon: Meredith Pel, MD;  Location: [facility];  Service: Orthopedics;  Laterality: Right;  . SHOULDER ARTHROSCOPY W/ ROTATOR CUFF REPAIR Right    Social History   Occupational History  . Not on file.  Social History Main Topics  . Smoking status: Former Smoker    Packs/day: 2.00    Years: 49.00    Types: Cigarettes    Quit date: 03/22/1988  . Smokeless tobacco: Not on file  . Alcohol use No  . Drug use: No  . Sexual activity: Not on file

## 2016-07-25 DIAGNOSIS — H524 Presbyopia: Secondary | ICD-10-CM | POA: Diagnosis not present

## 2016-07-25 DIAGNOSIS — H1701 Adherent leukoma, right eye: Secondary | ICD-10-CM | POA: Diagnosis not present

## 2016-07-25 DIAGNOSIS — H5212 Myopia, left eye: Secondary | ICD-10-CM | POA: Diagnosis not present

## 2016-07-25 DIAGNOSIS — H2701 Aphakia, right eye: Secondary | ICD-10-CM | POA: Diagnosis not present

## 2016-07-25 DIAGNOSIS — Z961 Presence of intraocular lens: Secondary | ICD-10-CM | POA: Diagnosis not present

## 2016-07-25 DIAGNOSIS — H52202 Unspecified astigmatism, left eye: Secondary | ICD-10-CM | POA: Diagnosis not present

## 2016-08-13 DIAGNOSIS — L720 Epidermal cyst: Secondary | ICD-10-CM | POA: Diagnosis not present

## 2016-08-13 DIAGNOSIS — L300 Nummular dermatitis: Secondary | ICD-10-CM | POA: Diagnosis not present

## 2016-08-26 DIAGNOSIS — E785 Hyperlipidemia, unspecified: Secondary | ICD-10-CM | POA: Diagnosis not present

## 2016-08-26 DIAGNOSIS — I25119 Atherosclerotic heart disease of native coronary artery with unspecified angina pectoris: Secondary | ICD-10-CM | POA: Diagnosis not present

## 2016-08-26 DIAGNOSIS — R0602 Shortness of breath: Secondary | ICD-10-CM | POA: Diagnosis not present

## 2016-08-26 DIAGNOSIS — I10 Essential (primary) hypertension: Secondary | ICD-10-CM | POA: Diagnosis not present

## 2016-08-26 DIAGNOSIS — R002 Palpitations: Secondary | ICD-10-CM | POA: Diagnosis not present

## 2016-09-02 DIAGNOSIS — R002 Palpitations: Secondary | ICD-10-CM | POA: Diagnosis not present

## 2016-09-02 DIAGNOSIS — R0602 Shortness of breath: Secondary | ICD-10-CM | POA: Diagnosis not present

## 2016-09-02 DIAGNOSIS — I10 Essential (primary) hypertension: Secondary | ICD-10-CM | POA: Diagnosis not present

## 2016-09-03 DIAGNOSIS — R002 Palpitations: Secondary | ICD-10-CM | POA: Diagnosis not present

## 2016-10-02 DIAGNOSIS — R002 Palpitations: Secondary | ICD-10-CM | POA: Diagnosis not present

## 2016-11-14 DIAGNOSIS — I10 Essential (primary) hypertension: Secondary | ICD-10-CM | POA: Diagnosis not present

## 2016-11-14 DIAGNOSIS — I25119 Atherosclerotic heart disease of native coronary artery with unspecified angina pectoris: Secondary | ICD-10-CM | POA: Diagnosis not present

## 2016-11-14 DIAGNOSIS — R002 Palpitations: Secondary | ICD-10-CM | POA: Diagnosis not present

## 2016-11-28 DIAGNOSIS — I10 Essential (primary) hypertension: Secondary | ICD-10-CM | POA: Diagnosis not present

## 2016-12-19 DIAGNOSIS — I25119 Atherosclerotic heart disease of native coronary artery with unspecified angina pectoris: Secondary | ICD-10-CM | POA: Diagnosis not present

## 2016-12-19 DIAGNOSIS — E785 Hyperlipidemia, unspecified: Secondary | ICD-10-CM | POA: Diagnosis not present

## 2016-12-19 DIAGNOSIS — I10 Essential (primary) hypertension: Secondary | ICD-10-CM | POA: Diagnosis not present

## 2017-01-02 DIAGNOSIS — I10 Essential (primary) hypertension: Secondary | ICD-10-CM | POA: Diagnosis not present

## 2017-01-08 DIAGNOSIS — M25432 Effusion, left wrist: Secondary | ICD-10-CM | POA: Diagnosis not present

## 2017-01-08 DIAGNOSIS — M109 Gout, unspecified: Secondary | ICD-10-CM | POA: Diagnosis not present

## 2017-01-08 DIAGNOSIS — M25532 Pain in left wrist: Secondary | ICD-10-CM | POA: Diagnosis not present

## 2017-01-08 DIAGNOSIS — M19032 Primary osteoarthritis, left wrist: Secondary | ICD-10-CM | POA: Diagnosis not present

## 2017-01-08 DIAGNOSIS — M25439 Effusion, unspecified wrist: Secondary | ICD-10-CM | POA: Diagnosis not present

## 2017-01-15 DIAGNOSIS — Z6838 Body mass index (BMI) 38.0-38.9, adult: Secondary | ICD-10-CM | POA: Diagnosis not present

## 2017-01-15 DIAGNOSIS — M109 Gout, unspecified: Secondary | ICD-10-CM | POA: Diagnosis not present

## 2017-01-15 DIAGNOSIS — I10 Essential (primary) hypertension: Secondary | ICD-10-CM | POA: Diagnosis not present

## 2017-01-15 DIAGNOSIS — M1990 Unspecified osteoarthritis, unspecified site: Secondary | ICD-10-CM | POA: Diagnosis not present

## 2017-01-24 DIAGNOSIS — Z6830 Body mass index (BMI) 30.0-30.9, adult: Secondary | ICD-10-CM | POA: Diagnosis not present

## 2017-01-24 DIAGNOSIS — M19041 Primary osteoarthritis, right hand: Secondary | ICD-10-CM | POA: Diagnosis not present

## 2017-01-24 DIAGNOSIS — M19042 Primary osteoarthritis, left hand: Secondary | ICD-10-CM | POA: Diagnosis not present

## 2017-02-21 DIAGNOSIS — M1A9XX0 Chronic gout, unspecified, without tophus (tophi): Secondary | ICD-10-CM | POA: Insufficient documentation

## 2017-02-21 DIAGNOSIS — M109 Gout, unspecified: Secondary | ICD-10-CM | POA: Diagnosis not present

## 2017-02-21 HISTORY — DX: Chronic gout, unspecified, without tophus (tophi): M1A.9XX0

## 2017-03-20 DIAGNOSIS — I159 Secondary hypertension, unspecified: Secondary | ICD-10-CM | POA: Diagnosis not present

## 2017-03-20 DIAGNOSIS — I2510 Atherosclerotic heart disease of native coronary artery without angina pectoris: Secondary | ICD-10-CM | POA: Diagnosis not present

## 2017-03-26 DIAGNOSIS — I159 Secondary hypertension, unspecified: Secondary | ICD-10-CM | POA: Diagnosis not present

## 2017-04-23 IMAGING — CT CT SHOULDER*R* W/O CM
3 series · 10 of 14 positions shown, 11 images · non-contrast
Comparison: None.

CLINICAL DATA: Osteoarthritis of the right shoulder. Pain for 2
years.

EXAM:
CT OF THE RIGHT SHOULDER WITHOUT CONTRAST
TECHNIQUE: Multidetector CT imaging was performed according to the standard
protocol. Multiplanar CT image reconstructions were also generated.

[Series 5: shoulder soft · axial · 0.54mm/px · z∈[-208,-10]mm · 3 of 80 slices shown, 4 images]
[im 1/80  soft-tissue]
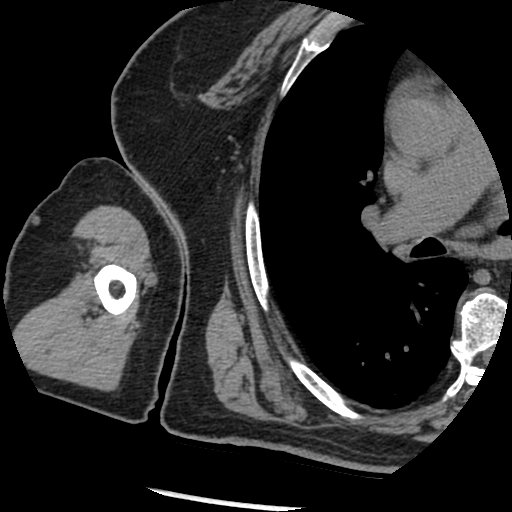
[im 1/80  bone]
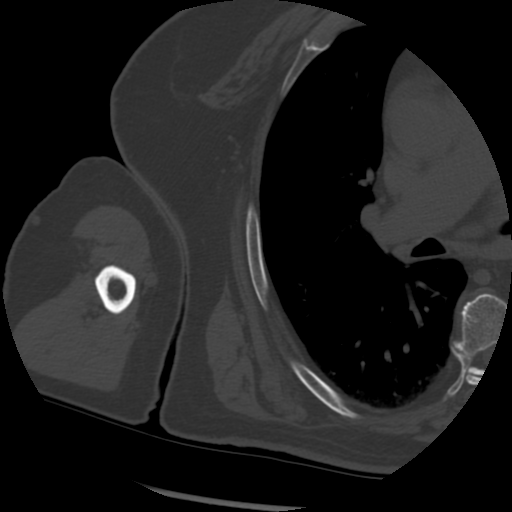
[im 40/80  bone]
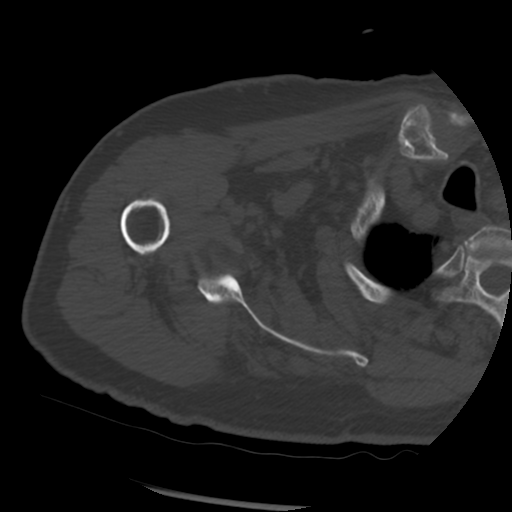
[im 80/80  bone]
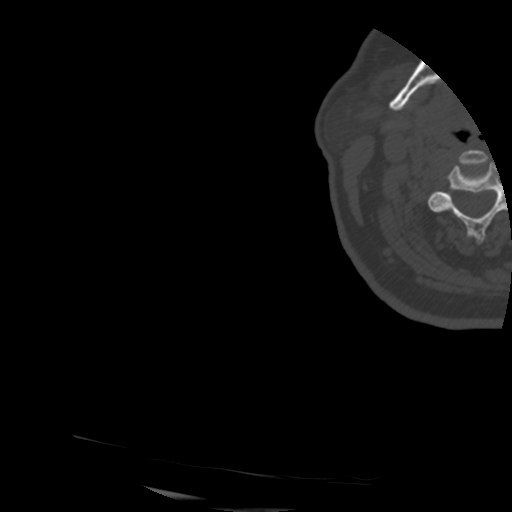

[Series 300: sag soft · sagittal · 0.54mm/px · 4 of 126 slices shown]
[im 26/126  soft-tissue]
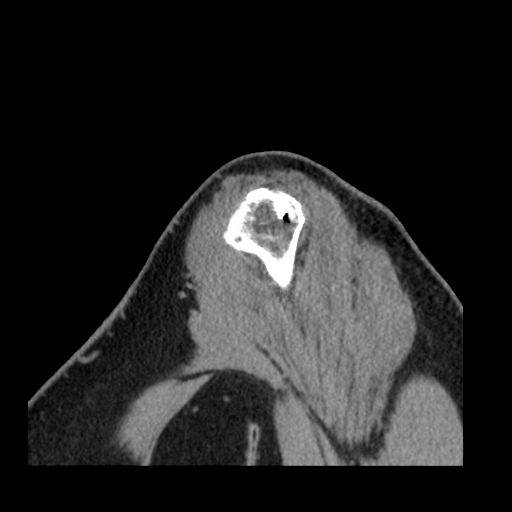
[im 51/126  soft-tissue]
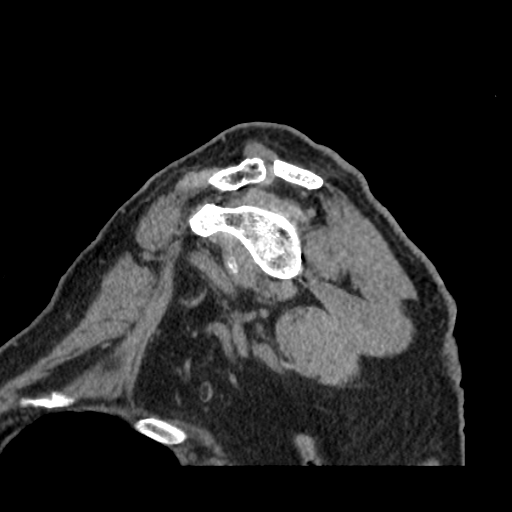
[im 76/126  soft-tissue]
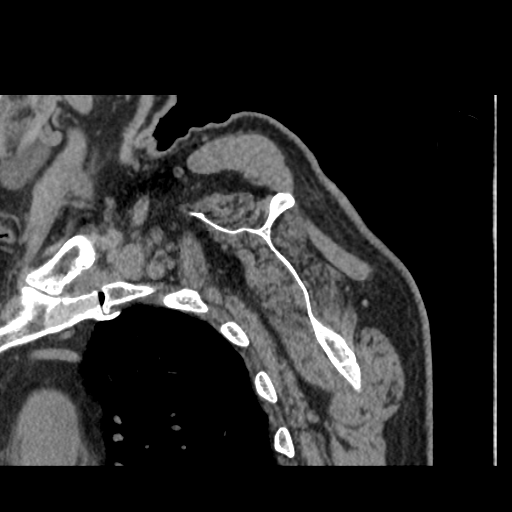
[im 101/126  soft-tissue]
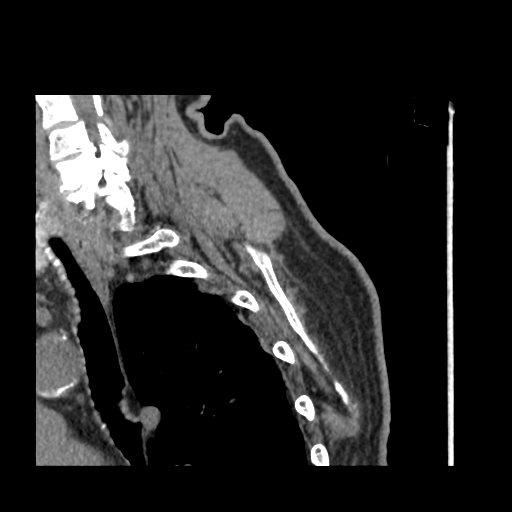

[Series 301: cor soft · coronal · 0.54mm/px · 3 of 99 slices shown]
[im 25/99  soft-tissue]
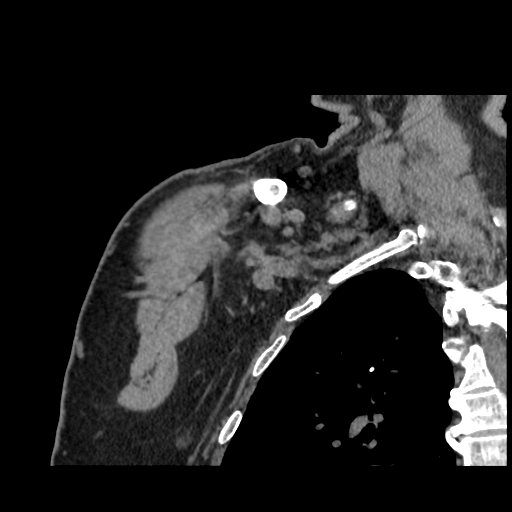
[im 50/99  soft-tissue]
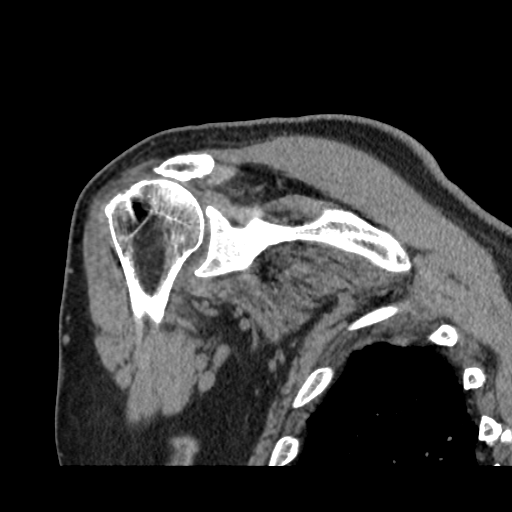
[im 74/99  soft-tissue]
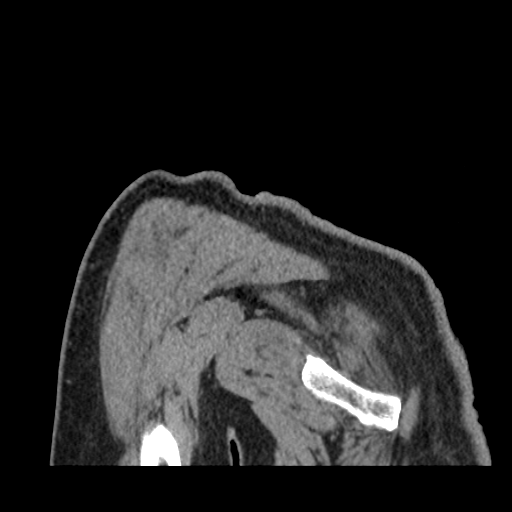

[10 of 14 positions shown; findings below may reference images not displayed]

FINDINGS: There is no acute fracture or dislocation. There is evidence prior
rotator cuff repair. Loss of the normal acromiohumeral distance as
can be seen with a rotator cuff tear. Mild osteoarthritis of the
right glenohumeral joint. Moderate degenerative changes of the
acromioclavicular joint.

The muscles are normal. There is mild atrophy of the supraspinatus
and infraspinatus muscle. The remainder the muscles are normal.
There is no fluid collection or hematoma.

The visualized right lung demonstrates emphysematous changes.
IMPRESSION: 1. Mild osteoarthritis of the right glenohumeral joint.

## 2017-05-09 DIAGNOSIS — R5381 Other malaise: Secondary | ICD-10-CM | POA: Diagnosis not present

## 2017-05-09 DIAGNOSIS — Z0001 Encounter for general adult medical examination with abnormal findings: Secondary | ICD-10-CM | POA: Diagnosis not present

## 2017-05-09 DIAGNOSIS — M1990 Unspecified osteoarthritis, unspecified site: Secondary | ICD-10-CM | POA: Diagnosis not present

## 2017-05-09 DIAGNOSIS — E559 Vitamin D deficiency, unspecified: Secondary | ICD-10-CM | POA: Insufficient documentation

## 2017-05-09 DIAGNOSIS — E1165 Type 2 diabetes mellitus with hyperglycemia: Secondary | ICD-10-CM

## 2017-05-09 DIAGNOSIS — K219 Gastro-esophageal reflux disease without esophagitis: Secondary | ICD-10-CM | POA: Insufficient documentation

## 2017-05-09 DIAGNOSIS — Z125 Encounter for screening for malignant neoplasm of prostate: Secondary | ICD-10-CM | POA: Diagnosis not present

## 2017-05-09 DIAGNOSIS — R5383 Other fatigue: Secondary | ICD-10-CM | POA: Diagnosis not present

## 2017-05-09 DIAGNOSIS — F5104 Psychophysiologic insomnia: Secondary | ICD-10-CM | POA: Insufficient documentation

## 2017-05-09 DIAGNOSIS — I10 Essential (primary) hypertension: Secondary | ICD-10-CM | POA: Diagnosis not present

## 2017-05-09 DIAGNOSIS — Z1211 Encounter for screening for malignant neoplasm of colon: Secondary | ICD-10-CM | POA: Diagnosis not present

## 2017-05-09 DIAGNOSIS — E782 Mixed hyperlipidemia: Secondary | ICD-10-CM | POA: Insufficient documentation

## 2017-05-09 DIAGNOSIS — M1A9XX0 Chronic gout, unspecified, without tophus (tophi): Secondary | ICD-10-CM | POA: Diagnosis not present

## 2017-05-09 HISTORY — DX: Gastro-esophageal reflux disease without esophagitis: K21.9

## 2017-05-09 HISTORY — DX: Psychophysiologic insomnia: F51.04

## 2017-05-09 HISTORY — DX: Type 2 diabetes mellitus with hyperglycemia: E11.65

## 2017-05-09 HISTORY — DX: Mixed hyperlipidemia: E78.2

## 2017-05-09 HISTORY — DX: Vitamin D deficiency, unspecified: E55.9

## 2017-05-15 DIAGNOSIS — I10 Essential (primary) hypertension: Secondary | ICD-10-CM | POA: Diagnosis not present

## 2017-05-15 DIAGNOSIS — I6523 Occlusion and stenosis of bilateral carotid arteries: Secondary | ICD-10-CM | POA: Diagnosis not present

## 2017-05-26 DIAGNOSIS — E782 Mixed hyperlipidemia: Secondary | ICD-10-CM | POA: Diagnosis not present

## 2017-05-26 DIAGNOSIS — I6523 Occlusion and stenosis of bilateral carotid arteries: Secondary | ICD-10-CM | POA: Diagnosis not present

## 2017-05-26 DIAGNOSIS — Z955 Presence of coronary angioplasty implant and graft: Secondary | ICD-10-CM

## 2017-05-26 DIAGNOSIS — E1165 Type 2 diabetes mellitus with hyperglycemia: Secondary | ICD-10-CM | POA: Diagnosis not present

## 2017-05-26 DIAGNOSIS — Z7902 Long term (current) use of antithrombotics/antiplatelets: Secondary | ICD-10-CM | POA: Diagnosis not present

## 2017-05-26 DIAGNOSIS — Z7982 Long term (current) use of aspirin: Secondary | ICD-10-CM | POA: Diagnosis not present

## 2017-05-26 DIAGNOSIS — I10 Essential (primary) hypertension: Secondary | ICD-10-CM | POA: Diagnosis not present

## 2017-05-26 DIAGNOSIS — I2510 Atherosclerotic heart disease of native coronary artery without angina pectoris: Secondary | ICD-10-CM | POA: Diagnosis not present

## 2017-05-26 HISTORY — DX: Presence of coronary angioplasty implant and graft: Z95.5

## 2017-07-10 DIAGNOSIS — Z23 Encounter for immunization: Secondary | ICD-10-CM | POA: Diagnosis not present

## 2017-07-17 DIAGNOSIS — L570 Actinic keratosis: Secondary | ICD-10-CM | POA: Diagnosis not present

## 2017-07-17 DIAGNOSIS — C44311 Basal cell carcinoma of skin of nose: Secondary | ICD-10-CM | POA: Diagnosis not present

## 2017-07-17 DIAGNOSIS — D0439 Carcinoma in situ of skin of other parts of face: Secondary | ICD-10-CM | POA: Diagnosis not present

## 2017-07-28 DIAGNOSIS — E119 Type 2 diabetes mellitus without complications: Secondary | ICD-10-CM | POA: Diagnosis not present

## 2017-07-28 DIAGNOSIS — H5212 Myopia, left eye: Secondary | ICD-10-CM | POA: Diagnosis not present

## 2017-07-28 DIAGNOSIS — H52202 Unspecified astigmatism, left eye: Secondary | ICD-10-CM | POA: Diagnosis not present

## 2017-07-28 DIAGNOSIS — Z961 Presence of intraocular lens: Secondary | ICD-10-CM | POA: Diagnosis not present

## 2017-07-28 DIAGNOSIS — H1701 Adherent leukoma, right eye: Secondary | ICD-10-CM | POA: Diagnosis not present

## 2017-07-28 DIAGNOSIS — H524 Presbyopia: Secondary | ICD-10-CM | POA: Diagnosis not present

## 2017-07-28 DIAGNOSIS — H2701 Aphakia, right eye: Secondary | ICD-10-CM | POA: Diagnosis not present

## 2017-09-03 DIAGNOSIS — C44311 Basal cell carcinoma of skin of nose: Secondary | ICD-10-CM | POA: Diagnosis not present

## 2018-05-22 ENCOUNTER — Encounter: Payer: Self-pay | Admitting: Cardiology

## 2018-05-22 ENCOUNTER — Ambulatory Visit (INDEPENDENT_AMBULATORY_CARE_PROVIDER_SITE_OTHER): Payer: Medicare Other | Admitting: Cardiology

## 2018-05-22 VITALS — BP 168/96 | HR 59 | Ht 68.0 in | Wt 216.8 lb

## 2018-05-22 DIAGNOSIS — I25119 Atherosclerotic heart disease of native coronary artery with unspecified angina pectoris: Secondary | ICD-10-CM | POA: Diagnosis not present

## 2018-05-22 DIAGNOSIS — E1165 Type 2 diabetes mellitus with hyperglycemia: Secondary | ICD-10-CM | POA: Diagnosis not present

## 2018-05-22 DIAGNOSIS — I10 Essential (primary) hypertension: Secondary | ICD-10-CM | POA: Diagnosis not present

## 2018-05-22 DIAGNOSIS — E785 Hyperlipidemia, unspecified: Secondary | ICD-10-CM

## 2018-05-22 DIAGNOSIS — I6523 Occlusion and stenosis of bilateral carotid arteries: Secondary | ICD-10-CM

## 2018-05-22 NOTE — Patient Instructions (Addendum)
Medication Instructions:  Your physician recommends that you continue on your current medications as directed. Please refer to the Current Medication list given to you today.   Labwork: None  Testing/Procedures: You had an EKG today.   Your physician has requested that you have a carotid duplex. This test is an ultrasound of the carotid arteries in your neck. It looks at blood flow through these arteries that supply the brain with blood. Allow one hour for this exam. There are no restrictions or special instructions.   Follow-Up: Your physician wants you to follow-up in: 1 year. You will receive a reminder letter in the mail two months in advance. If you don't receive a letter, please call our office to schedule the follow-up appointment.   If you need a refill on your cardiac medications before your next appointment, please call your pharmacy.   Thank you for choosing [facility]! Robyne Peers, RN [phone]      Hypertension Hypertension is another name for high blood pressure. High blood pressure forces your heart to work harder to pump blood. This can cause problems over time. There are two numbers in a blood pressure reading. There is a top number (systolic) over a bottom number (diastolic). It is best to have a blood pressure below 120/80. Healthy choices can help lower your blood pressure. You may need medicine to help lower your blood pressure if:  Your blood pressure cannot be lowered with healthy choices.  Your blood pressure is higher than 130/80.  Follow these instructions at home: Eating and drinking  If directed, follow the DASH eating plan. This diet includes: ? Filling half of your plate at each meal with fruits and vegetables. ? Filling one quarter of your plate at each meal with whole grains. Whole grains include whole wheat pasta, brown rice, and whole grain bread. ? Eating or drinking low-fat dairy products, such as skim milk or low-fat  yogurt. ? Filling one quarter of your plate at each meal with low-fat (lean) proteins. Low-fat proteins include fish, skinless chicken, eggs, beans, and tofu. ? Avoiding fatty meat, cured and processed meat, or chicken with skin. ? Avoiding premade or processed food.  Eat less than 1,500 mg of salt (sodium) a day.  Limit alcohol use to no more than 1 drink a day for nonpregnant women and 2 drinks a day for men. One drink equals 12 oz of beer, 5 oz of wine, or 1 oz of hard liquor. Lifestyle  Work with your doctor to stay at a healthy weight or to lose weight. Ask your doctor what the best weight is for you.  Get at least 30 minutes of exercise that causes your heart to beat faster (aerobic exercise) most days of the week. This may include walking, swimming, or biking.  Get at least 30 minutes of exercise that strengthens your muscles (resistance exercise) at least 3 days a week. This may include lifting weights or pilates.  Do not use any products that contain nicotine or tobacco. This includes cigarettes and e-cigarettes. If you need help quitting, ask your doctor.  Check your blood pressure at home as told by your doctor.  Keep all follow-up visits as told by your doctor. This is important. Medicines  Take over-the-counter and prescription medicines only as told by your doctor. Follow directions carefully.  Do not skip doses of blood pressure medicine. The medicine does not work as well if you skip doses. Skipping doses also puts you at risk for problems.  Ask your doctor about side effects or reactions to medicines that you should watch for. Contact a doctor if:  You think you are having a reaction to the medicine you are taking.  You have headaches that keep coming back (recurring).  You feel dizzy.  You have swelling in your ankles.  You have trouble with your vision. Get help right away if:  You get a very bad headache.  You start to feel confused.  You feel weak or  numb.  You feel faint.  You get very bad pain in your: ? Chest. ? Belly (abdomen).  You throw up (vomit) more than once.  You have trouble breathing. Summary  Hypertension is another name for high blood pressure.  Making healthy choices can help lower blood pressure. If your blood pressure cannot be controlled with healthy choices, you may need to take medicine. This information is not intended to replace advice given to you by your health care provider. Make sure you discuss any questions you have with your health care provider. Document Released: 02/19/2008 Document Revised: 07/31/2016 Document Reviewed: 07/31/2016 Elsevier Interactive Patient Education  [employer].

## 2018-05-22 NOTE — Progress Notes (Signed)
Cardiology Office Note:    Date:  05/22/2018   ID:  Benjamin Andrade, DOB 12/01/1934, MRN [id number]  PCP:  Myrlene Broker, MD  Cardiologist:  Shirlee More, MD    Referring MD: Myrlene Broker, MD    ASSESSMENT:    1. Coronary artery disease involving native coronary artery of native heart with angina pectoris ([place])   2. Hyperlipidemia, unspecified hyperlipidemia type   3. Essential hypertension   4. Type 2 diabetes mellitus with hyperglycemia, without long-term current use of insulin (HCC)    PLAN:    In order of problems listed above:  1. Stable he has had no anginal discomfort at this time we will continue his current medical treatment including clopidogrel and lipid-lowering therapy along with calcium channel blocker.  After discussion we do not feel he requires an ischemia evaluation at this time 2. Continue current lipid-lowering therapy with Zetia he has arrangements for lipid profile with a wellness exam with his PCP if LDL remains greater than 70 I would attempt placing on a low-dose of low intensity statin and if intolerant PC SK 9 in view of his CAD 3. Blood pressure is elevated in my office he tells me his home blood pressure runs closer to 140/80 we discussed additional antihypertensive therapy he does not want to do it at this time so we will continue to monitor at home continue his current treatment including loop diuretic calcium channel blocker 4. Stable managed by his PCP 5. Carotid stenosis duplex ordered if he had severe stenosis or restenosis right endarterectomy require referral to vascular surgery he has had no TIA  Next appointment: One year   Medication Adjustments/Labs and Tests Ordered: Current medicines are reviewed at length with the patient today.  Concerns regarding medicines are outlined above.  No orders of the defined types were placed in this encounter.  No orders of the defined types were placed in this encounter.   Chief Complaint  Patient  presents with  . Follow-up     for CAD and my BP    History of Present Illness:    Benjamin Andrade is a 83 y.o. male with a hx of CAD S/P PCI in 2014 with DES to [facility] in 2014 Dyslipidemia and resistant HTN last seen by me 12/19/16 at [facility] cardiology.. Compliance with diet, lifestyle and medications: Yes  He is pleased with the quality of his life and has had no angina dyspnea palpitation or syncope he has some edema with a calcium channel blocker but no orthopnea.  He had a cerebrovascular duplex done 1 year ago I cannot access the report he told me was reassuring and requires follow-up that was scheduled my office.  He says his home blood pressures run close to 140/80 we discussed additional therapy and he prefers to hold at this time.  He takes Zetia for lipid lowering and is due for labs with his PCP next week and may require a coincidence statin or PC SK 9 to achieve target Past Medical History:  Diagnosis Date  . Arthritis   . Cancer ([place])    skin-lip-face  . Carotid stenosis, bilateral 05/09/2016  . Coronary artery disease   . Coronary artery disease involving native coronary artery of native heart with angina pectoris ([place]) 08/22/2015   Overview:  80% CX - DES oct 2014 80% CX - DES oct 2014  . Diabetes mellitus without complication (HCC)    prediabetic  . Dysrhythmia    occ  . Essential hypertension  08/22/2015  . GERD (gastroesophageal reflux disease)   . GERD without esophagitis 05/09/2017  . H/O heart artery stent 05/26/2017  . Hyperlipemia 08/22/2015  . Hypertension   . Myocardial infarction ([place])   . Type 2 diabetes mellitus with hyperglycemia, without long-term current use of insulin ([place]) 05/09/2017    Past Surgical History:  Procedure Laterality Date  . BACK SURGERY    . CAROTID ENDARTERECTOMY Left   . CATARACT EXTRACTION    . INGUINAL HERNIA REPAIR    . KNEE ARTHROSCOPY Right   . REVERSE SHOULDER ARTHROPLASTY Right 03/28/2016   Procedure: REVERSE SHOULDER ARTHROPLASTY;   Surgeon: Meredith Pel, MD;  Location: [place];  Service: Orthopedics;  Laterality: Right;  . SHOULDER ARTHROSCOPY W/ ROTATOR CUFF REPAIR Right   . SKIN BIOPSY      Current Medications: Current Meds  Medication Sig  . albuterol (PROVENTIL HFA;VENTOLIN HFA) 108 (90 Base) MCG/ACT inhaler Inhale 2 puffs into the lungs every 6 (six) hours as needed for wheezing.  Marland Kitchen amLODipine (NORVASC) 5 MG tablet Take 1 tablet by mouth daily.  . carvedilol (COREG) 25 MG tablet Take 25 mg by mouth 2 (two) times daily.  . celecoxib (CELEBREX) 200 MG capsule Take 1 capsule by mouth daily.  . Cholecalciferol (VITAMIN D3) 2000 units capsule Take 2,000 Units by mouth every morning.  . clopidogrel (PLAVIX) 75 MG tablet Take 75 mg by mouth every morning.   . colchicine 0.6 MG tablet Take 0.6 mg by mouth daily as needed.   . furosemide (LASIX) 20 MG tablet Take 1 tablet by mouth daily.  Marland Kitchen ipratropium-albuterol (DUONEB) 0.5-2.5 (3) MG/3ML SOLN Inhale 3 mLs into the lungs every 6 (six) hours as needed.  . metFORMIN (GLUCOPHAGE) 500 MG tablet Take 500 mg by mouth daily as needed (Per sliding scale). Reported on 03/21/2016  . Multiple Vitamin (MULTI-VITAMINS) TABS Take 1 tablet by mouth every morning.   . nitroGLYCERIN (NITROSTAT) 0.4 MG SL tablet Place 0.4 mg under the tongue every 5 (five) minutes as needed for chest pain.   . pantoprazole (PROTONIX) 40 MG tablet Take 40 mg by mouth every morning.   . traZODone (DESYREL) 150 MG tablet Take 1 tablet by mouth daily.  . vitamin B-12 (CYANOCOBALAMIN) 1000 MCG tablet Take 1,000 mcg by mouth every morning.     Allergies:   No known allergies   Social History   Socioeconomic History  . Marital status: Married    Spouse name: Not on file  . Number of children: Not on file  . Years of education: Not on file  . Highest education level: Not on file  Occupational History  . Not on file  Social Needs  . Financial resource strain: Not on file  . Food insecurity:     Worry: Not on file    Inability: Not on file  . Transportation needs:    Medical: Not on file    Non-medical: Not on file  Tobacco Use  . Smoking status: Former Smoker    Packs/day: 2.00    Years: 49.00    Pack years: 98.00    Types: Cigarettes    Last attempt to quit: 03/22/1988    Years since quitting: 30.1  . Smokeless tobacco: Never Used  Substance and Sexual Activity  . Alcohol use: No  . Drug use: No  . Sexual activity: Not on file  Lifestyle  . Physical activity:    Days per week: Not on file    Minutes per session:  Not on file  . Stress: Not on file  Relationships  . Social connections:    Talks on phone: Not on file    Gets together: Not on file    Attends religious service: Not on file    Active member of club or organization: Not on file    Attends meetings of clubs or organizations: Not on file    Relationship status: Not on file  Other Topics Concern  . Not on file  Social History Narrative  . Not on file     Family History: The patient's family history includes Cancer in his father, mother, sister, and sister; Coronary artery disease in his sister. ROS:   Please see the history of present illness.    All other systems reviewed and are negative.  EKGs/Labs/Other Studies Reviewed:    The following studies were reviewed today:  EKG:  EKG ordered today.  The ekg ordered today demonstrates SRTh and normal  Recent Labs: No results found for requested labs within last 8760 hours.  Recent Lipid Panel    CHOL 160 05/09/2017  TRIG 95 05/09/2017  HDL 37 05/09/2017   No results found for: CHOL, TRIG, HDL, CHOLHDL, VLDL, LDLCALC, LDLDIRECT  Physical Exam:    VS:  BP (!) 168/96 (BP Location: Right Arm, Patient Position: Sitting, Cuff Size: Large)   Pulse (!) 59   Ht 5\' 8"  (1.727 m)   Wt 216 lb 12.8 oz (98.3 kg)   SpO2 96%   BMI 32.96 kg/m     Wt Readings from Last 3 Encounters:  05/22/18 216 lb 12.8 oz (98.3 kg)  03/28/16 219 lb (99.3 kg)  03/22/16  219 lb 9.6 oz (99.6 kg)     GEN:  Well nourished, well developed in no acute distress HEENT: Normal NECK: No JVD; No carotid bruits LYMPHATICS: No lymphadenopathy CARDIAC: RRR, no murmurs, rubs, gallops RESPIRATORY:  Clear to auscultation without rales, wheezing or rhonchi  ABDOMEN: Soft, non-tender, non-distended MUSCULOSKELETAL:  2+ boggy bilateral to the knee edema; No deformity  SKIN: Warm and dry NEUROLOGIC:  Alert and oriented x 3 PSYCHIATRIC:  Normal affect    Signed, Shirlee More, MD  05/22/2018 3:44 PM    [facility]

## 2018-07-06 ENCOUNTER — Ambulatory Visit (INDEPENDENT_AMBULATORY_CARE_PROVIDER_SITE_OTHER): Payer: Medicare Other

## 2018-07-06 DIAGNOSIS — I6523 Occlusion and stenosis of bilateral carotid arteries: Secondary | ICD-10-CM | POA: Diagnosis not present

## 2018-07-06 NOTE — Progress Notes (Signed)
Carotid artery duplex exam has been performed. No ICA stenosis was seen.  Jimmy Alford Gamero RDCS, RVT

## 2018-11-04 ENCOUNTER — Encounter: Payer: Self-pay | Admitting: Cardiology

## 2018-11-04 ENCOUNTER — Ambulatory Visit (INDEPENDENT_AMBULATORY_CARE_PROVIDER_SITE_OTHER): Payer: Medicare Other | Admitting: Cardiology

## 2018-11-04 VITALS — BP 178/92 | HR 60 | Ht 68.0 in | Wt 226.4 lb

## 2018-11-04 DIAGNOSIS — I10 Essential (primary) hypertension: Secondary | ICD-10-CM | POA: Diagnosis not present

## 2018-11-04 DIAGNOSIS — I6523 Occlusion and stenosis of bilateral carotid arteries: Secondary | ICD-10-CM | POA: Diagnosis not present

## 2018-11-04 DIAGNOSIS — I25119 Atherosclerotic heart disease of native coronary artery with unspecified angina pectoris: Secondary | ICD-10-CM | POA: Diagnosis not present

## 2018-11-04 DIAGNOSIS — E785 Hyperlipidemia, unspecified: Secondary | ICD-10-CM | POA: Diagnosis not present

## 2018-11-04 MED ORDER — CLONIDINE HCL 0.1 MG PO TABS: 0.1000 mg | ORAL_TABLET | Freq: Every day | ORAL | 2 refills | Status: DC

## 2018-11-04 MED ORDER — TELMISARTAN 40 MG PO TABS: 40.0000 mg | ORAL_TABLET | Freq: Every day | ORAL | 2 refills | Status: DC

## 2018-11-04 NOTE — Progress Notes (Signed)
Cardiology Office Note:    Date:  11/04/2018   ID:  Benjamin Andrade, DOB 06/17/1935, MRN [id number]  PCP:  Myrlene Broker, MD  Cardiologist:  Shirlee More, MD    Referring MD: Myrlene Broker, MD    ASSESSMENT:    1. Coronary artery disease involving native coronary artery of native heart with angina pectoris ([place])   2. Carotid stenosis, bilateral   3. Essential hypertension   4. Hyperlipidemia, unspecified hyperlipidemia type    PLAN:    In order of problems listed above:  1. Stable New [place] Heart Association class I continue treatment including antiplatelet clopidogrel beta-blocker and lipid-lowering therapy. 2. Stable asymptomatic 3. Poorly controlled we will start a potent ARB as well as low-dose clonidine at bedtime 4. He presently is not on a statin.   Next appointment: 3 months   Medication Adjustments/Labs and Tests Ordered: Current medicines are reviewed at length with the patient today.  Concerns regarding medicines are outlined above.  No orders of the defined types were placed in this encounter.  No orders of the defined types were placed in this encounter.   No chief complaint on file.   History of Present Illness:    Benjamin Andrade is a 84 y.o. male with a hx of CAD S/P PCI in 2014 with DES to [facility] in 2014 Dyslipidemia L CEA and  resistant HTN  last seen 05/22/18. Compliance with diet, lifestyle and medications: Yes  he feels well but his blood pressure is consistently running in the range of 1 70-200 heart rate 90-100 compliance of medications.  Has had no edema chest pain shortness of breath palpitation or syncope labs in September showed normal renal function.  I will have him streamlined a single beta-blocker continue his diuretic along with calcium channel blocker add low-dose clonidine and a potent ARB 1 week we will check lab work including renal and aldosterone level renal function and bring a list of his blood pressures to my office.  I reviewed his  carotid duplex that shows no stenosis  Carotid duplex 07/06/18:    Right Carotid: Velocities in the right ICA are consistent with a 1-39% stenosis.                The extracranial vessels were near-normal with only minimal wall                thickening or plaque.  Left Carotid: Velocities in the left ICA are consistent with a 1-39% stenosis.               The extracranial vessels were near-normal with only minimal wall               thickening or plaque.  Vertebrals:  Bilateral vertebral arteries demonstrate antegrade flow. Subclavians: Normal flow hemodynamics were seen in bilateral subclavian              arteries. Past Medical History:  Diagnosis Date  . Arthritis   . Cancer ([place])    skin-lip-face  . Carotid stenosis, bilateral 05/09/2016  . Coronary artery disease   . Coronary artery disease involving native coronary artery of native heart with angina pectoris ([place]) 08/22/2015   Overview:  80% CX - DES oct 2014 80% CX - DES oct 2014  . Diabetes mellitus without complication (HCC)    prediabetic  . Dysrhythmia    occ  . Essential hypertension 08/22/2015  . GERD (gastroesophageal reflux disease)   . GERD without esophagitis 05/09/2017  .  H/O heart artery stent 05/26/2017  . Hyperlipemia 08/22/2015  . Hypertension   . Myocardial infarction ([place])   . Type 2 diabetes mellitus with hyperglycemia, without long-term current use of insulin ([place]) 05/09/2017    Past Surgical History:  Procedure Laterality Date  . BACK SURGERY    . CAROTID ENDARTERECTOMY Left   . CATARACT EXTRACTION    . INGUINAL HERNIA REPAIR    . KNEE ARTHROSCOPY Right   . REVERSE SHOULDER ARTHROPLASTY Right 03/28/2016   Procedure: REVERSE SHOULDER ARTHROPLASTY;  Surgeon: Meredith Pel, MD;  Location: [place];  Service: Orthopedics;  Laterality: Right;  . SHOULDER ARTHROSCOPY W/ ROTATOR CUFF REPAIR Right   . SKIN BIOPSY      Current Medications: Current Meds  Medication Sig  . albuterol (PROVENTIL  HFA;VENTOLIN HFA) 108 (90 Base) MCG/ACT inhaler Inhale 2 puffs into the lungs every 6 (six) hours as needed for wheezing.  Marland Kitchen amLODipine (NORVASC) 5 MG tablet Take 1 tablet by mouth daily.  . carvedilol (COREG) 25 MG tablet Take 25 mg by mouth 2 (two) times daily.  . celecoxib (CELEBREX) 200 MG capsule Take 1 capsule by mouth daily.  . clopidogrel (PLAVIX) 75 MG tablet Take 75 mg by mouth every morning.   . colchicine 0.6 MG tablet Take 0.6 mg by mouth daily as needed.   . furosemide (LASIX) 20 MG tablet Take 1 tablet by mouth 2 (two) times daily.   Marland Kitchen ipratropium-albuterol (DUONEB) 0.5-2.5 (3) MG/3ML SOLN Inhale 3 mLs into the lungs every 6 (six) hours as needed.  . metFORMIN (GLUCOPHAGE) 500 MG tablet Take 500 mg by mouth daily as needed (Per sliding scale). Reported on 03/21/2016  . metoprolol tartrate (LOPRESSOR) 50 MG tablet Take 50 mg by mouth 2 (two) times daily.  . Multiple Vitamin (MULTI-VITAMINS) TABS Take 1 tablet by mouth every morning.   . pantoprazole (PROTONIX) 40 MG tablet Take 40 mg by mouth every morning.      Allergies:   No known allergies   Social History   Socioeconomic History  . Marital status: Married    Spouse name: Not on file  . Number of children: Not on file  . Years of education: Not on file  . Highest education level: Not on file  Occupational History  . Not on file  Social Needs  . Financial resource strain: Not on file  . Food insecurity:    Worry: Not on file    Inability: Not on file  . Transportation needs:    Medical: Not on file    Non-medical: Not on file  Tobacco Use  . Smoking status: Former Smoker    Packs/day: 2.00    Years: 49.00    Pack years: 98.00    Types: Cigarettes    Last attempt to quit: 03/22/1988    Years since quitting: 30.6  . Smokeless tobacco: Never Used  Substance and Sexual Activity  . Alcohol use: No  . Drug use: No  . Sexual activity: Not on file  Lifestyle  . Physical activity:    Days per week: Not on file     Minutes per session: Not on file  . Stress: Not on file  Relationships  . Social connections:    Talks on phone: Not on file    Gets together: Not on file    Attends religious service: Not on file    Active member of club or organization: Not on file    Attends meetings of clubs or organizations:  Not on file    Relationship status: Not on file  Other Topics Concern  . Not on file  Social History Narrative  . Not on file     Family History: The patient's family history includes Cancer in his father, mother, sister, and sister; Coronary artery disease in his sister. ROS:   Please see the history of present illness.    All other systems reviewed and are negative.  EKGs/Labs/Other Studies Reviewed:    The following studies were reviewed today:  Recent Labs:  06/04/18 CMP normal  Chol 160 HDL 44 LDL 119 No results found for requested labs within last 8760 hours.  Recent Lipid Panel No results found for: CHOL, TRIG, HDL, CHOLHDL, VLDL, LDLCALC, LDLDIRECT  Physical Exam:    VS:  BP (!) 178/92 (BP Location: Left Arm, Patient Position: Sitting, Cuff Size: Large)   Pulse 60   Ht 5\' 8"  (1.727 m)   Wt 226 lb 6.4 oz (102.7 kg)   SpO2 96%   BMI 34.42 kg/m     Wt Readings from Last 3 Encounters:  11/04/18 226 lb 6.4 oz (102.7 kg)  05/22/18 216 lb 12.8 oz (98.3 kg)  03/28/16 219 lb (99.3 kg)     GEN:  Well nourished, well developed in no acute distress HEENT: Normal NECK: No JVD; No carotid bruits LYMPHATICS: No lymphadenopathy CARDIAC: RRR, no murmurs, rubs, gallops RESPIRATORY:  Clear to auscultation without rales, wheezing or rhonchi  ABDOMEN: Soft, non-tender, non-distended MUSCULOSKELETAL:  No edema; No deformity  SKIN: Warm and dry NEUROLOGIC:  Alert and oriented x 3 PSYCHIATRIC:  Normal affect    Signed, Shirlee More, MD  11/04/2018 2:21 PM    [facility]

## 2018-11-04 NOTE — Patient Instructions (Addendum)
Medication Instructions:  Your physician has recommended you make the following change in your medication:  STOP  Taking metoprolol START taking Clonidine 0.1 mg (1 Tablet) at bedtime daily START taking telmisartan 40 mg (1 tablet) daily   If you need a refill on your cardiac medications before your next appointment, please call your pharmacy.   Lab work: Your physician recommends that you return for lab work in 1 week for a CMP, lipid, aldosterone/renin ratio. Please return to our office. No appointment needed. YOU NEED TO FAST PRIOR TO coming for labs.  If you have labs (blood work) drawn today and your tests are completely normal, you will receive your results only by: Marland Kitchen MyChart Message (if you have MyChart) OR . A paper copy in the mail If you have any lab test that is abnormal or we need to change your treatment, we will call you to review the results.  Testing/Procedures: NONE  Follow-Up: At [facility], you and your health needs are our priority.  As part of our continuing mission to provide you with exceptional heart care, we have created designated Provider Care Teams.  These Care Teams include your primary Cardiologist (physician) and Advanced Practice Providers (APPs -  Physician Assistants and Nurse Practitioners) who all work together to provide you with the care you need, when you need it. You will need a follow up appointment in 3 months.   Any Other Special Instructions Will Be Listed Below  Your physician has requested that you regularly monitor and record your blood pressure readings at home. Please use the same machine at the same time of day to check your readings and record them to bring to your LAB VISIT. Telmisartan tablets What is this medicine? TELMISARTAN (tel mi SAR tan) is used to treat high blood pressure. This medicine may be used for other purposes; ask your health care provider or pharmacist if you have questions. COMMON BRAND NAME(S): Micardis What  should I tell my health care provider before I take this medicine? They need to know if you have any of these conditions: -if you are on a special diet, such as a low-salt diet -kidney or liver disease -an unusual or allergic reaction to telmisartan, other medicines, foods, dyes, or preservatives -pregnant or trying to get pregnant -breast-feeding How should I use this medicine? Take this medicine by mouth with a glass of water. Follow the directions on the prescription label. This medicine can be taken with or without food. Take your doses at regular intervals. Do not take your medicine more often than directed. Talk to your pediatrician regarding the use of this medicine in children. Special care may be needed. Overdosage: If you think you have taken too much of this medicine contact a poison control center or emergency room at once. NOTE: This medicine is only for you. Do not share this medicine with others. What if I miss a dose? If you miss a dose, take it as soon as you can. If it is almost time for your next dose, take only that dose. Do not take double or extra doses. What may interact with this medicine? -digoxin -potassium salts or potassium supplements -warfarin This list may not describe all possible interactions. Give your health care provider a list of all the medicines, herbs, non-prescription drugs, or dietary supplements you use. Also tell them if you smoke, drink alcohol, or use illegal drugs. Some items may interact with your medicine. What should I watch for while using this medicine?  Visit your doctor or health care professional for regular checks on your progress. Check your blood pressure as directed. Ask your doctor or health care professional what your blood pressure should be and when you should contact him or her. Call your doctor or health care professional if you notice an irregular or fast heart beat. Women should inform their doctor if they wish to become pregnant  or think they might be pregnant. There is a potential for serious side effects to an unborn child, particularly in the second or third trimester. Talk to your health care professional or pharmacist for more information. You may get drowsy or dizzy. Do not drive, use machinery, or do anything that needs mental alertness until you know how this drug affects you. Do not stand or sit up quickly, especially if you are an older patient. This reduces the risk of dizzy or fainting spells. Alcohol can make you more drowsy and dizzy. Avoid alcoholic drinks. Avoid salt substitutes unless you are told otherwise by your doctor or health care professional. Do not treat yourself for coughs, colds, or pain while you are taking this medicine without asking your doctor or health care professional for advice. Some ingredients may increase your blood pressure. What side effects may I notice from receiving this medicine? Side effects that you should report to your doctor or health care professional as soon as possible: -allergic reactions like skin rash, itching or hives, swelling of the face, lips, or tongue -breathing problems -dark urine -gout pain -muscle pains -slow heartbeat -trouble passing urine or change in the amount of urine -unusual bleeding or bruising -yellowing of the eyes or skin Side effects that usually do not require medical attention (report to your doctor or health care professional if they continue or are bothersome): -back pain -change in sex drive or performance -diarrhea -sore throat or stuffy nose This list may not describe all possible side effects. Call your doctor for medical advice about side effects. You may report side effects to FDA at 1-800-FDA-1088. Where should I keep my medicine? Keep out of the reach of children. Store at room temperature between 15 and 30 degrees C (59 and 86 degrees F). Tablets should not be removed from the blisters until right before use. Throw away any  unused medicine after the expiration date. NOTE: This sheet is a summary. It may not cover all possible information. If you have questions about this medicine, talk to your doctor, pharmacist, or health care provider.  2019 Elsevier/Gold Standard (2007-11-18 13:39:10) Clonidine tablets What is this medicine? CLONIDINE (KLOE ni deen) is used to treat high blood pressure. This medicine may be used for other purposes; ask your health care provider or pharmacist if you have questions. COMMON BRAND NAME(S): Catapres What should I tell my health care provider before I take this medicine? They need to know if you have any of these conditions: -kidney disease -an unusual or allergic reaction to clonidine, other medicines, foods, dyes, or preservatives -pregnant or trying to get pregnant -breast-feeding How should I use this medicine? Take this medicine by mouth with a glass of water. Follow the directions on the prescription label. Take your doses at regular intervals. Do not take your medicine more often than directed. Do not suddenly stop taking this medicine. You must gradually reduce the dose or you may get a dangerous increase in blood pressure. Ask your doctor or health care professional for advice. Talk to your pediatrician regarding the use of this medicine in children.  Special care may be needed. Overdosage: If you think you have taken too much of this medicine contact a poison control center or emergency room at once. NOTE: This medicine is only for you. Do not share this medicine with others. What if I miss a dose? If you miss a dose, take it as soon as you can. If it is almost time for your next dose, take only that dose. Do not take double or extra doses. What may interact with this medicine? Do not take this medicine with any of the following medications: -MAOIs like Carbex, Eldepryl, Marplan, Nardil, and Parnate This medicine may also interact with the following  medications: -barbiturate medicines for inducing sleep or treating seizures like phenobarbital -certain medicines for blood pressure, heart disease, irregular heart beat -certain medicines for depression, anxiety, or psychotic disturbances -prescription pain medicines This list may not describe all possible interactions. Give your health care provider a list of all the medicines, herbs, non-prescription drugs, or dietary supplements you use. Also tell them if you smoke, drink alcohol, or use illegal drugs. Some items may interact with your medicine. What should I watch for while using this medicine? Visit your doctor or health care professional for regular checks on your progress. Check your heart rate and blood pressure regularly while you are taking this medicine. Ask your doctor or health care professional what your heart rate should be and when you should contact him or her. You may get drowsy or dizzy. Do not drive, use machinery, or do anything that needs mental alertness until you know how this medicine affects you. To avoid dizzy or fainting spells, do not stand or sit up quickly, especially if you are an older person. Alcohol can make you more drowsy and dizzy. Avoid alcoholic drinks. Your mouth may get dry. Chewing sugarless gum or sucking hard candy, and drinking plenty of water will help. Do not treat yourself for coughs, colds, or pain while you are taking this medicine without asking your doctor or health care professional for advice. Some ingredients may increase your blood pressure. If you are going to have surgery tell your doctor or health care professional that you are taking this medicine. What side effects may I notice from receiving this medicine? Side effects that you should report to your doctor or health care professional as soon as possible: -allergic reactions like skin rash, itching or hives, swelling of the face, lips, or tongue -anxiety, nervousness -chest  pain -depression -fast, irregular heartbeat -swelling of feet or legs -unusually weak or tired Side effects that usually do not require medical attention (report to your doctor or health care professional if they continue or are bothersome): -change in sex drive or performance -constipation -headache This list may not describe all possible side effects. Call your doctor for medical advice about side effects. You may report side effects to FDA at 1-800-FDA-1088. Where should I keep my medicine? Keep out of the reach of children. Store at room temperature between 15 and 30 degrees C (59 and 86 degrees F). Protect from light. Keep container tightly closed. Throw away any unused medicine after the expiration date. NOTE: This sheet is a summary. It may not cover all possible information. If you have questions about this medicine, talk to your doctor, pharmacist, or health care provider.  2019 Elsevier/Gold Standard (2011-02-27 13:01:28)

## 2018-11-12 ENCOUNTER — Telehealth: Payer: Self-pay | Admitting: Cardiology

## 2018-11-12 NOTE — Telephone Encounter (Signed)
Attempted to contact patient with no answer and unable to leave message. Will continue efforts.

## 2018-11-12 NOTE — Telephone Encounter (Signed)
Patient taking clonidine and his BP is still climbing along with taking telmisartan. Please advise

## 2018-11-13 NOTE — Telephone Encounter (Signed)
Spoke with patient who was understood during his last office visit on 11/04/2018 that he was to stop all of his blood pressure medication and start on the new medication, clonidine and telmisartan. Advised patient that the only medication that was discontinued during that visit was metoprolol and that he should restart all other medications in addition to clonidine and telmisartan. Informed him to continue to monitor his blood pressure and to contact our office if it remains elevated. Patient verbalized understanding. No further questions.

## 2018-11-17 NOTE — Telephone Encounter (Signed)
Patient dropped off a BP log for Dr. Bettina Gavia to review before our last conversation as documented below. Dr. Bettina Gavia made aware as his BP was running higher than Dr. Bettina Gavia would like. Clarified with patient that he was not taking all medications correctly during the time the BP information was collected. Patient advised to continue taking all medications as prescribed and continue to monitor his blood pressure daily. Patient will continue to record his BP and bring another log in 2 weeks for Dr. Bettina Gavia to review. Patient verbalized understanding. No further questions.

## 2018-11-20 LAB — ALDOSTERONE + RENIN ACTIVITY W/ RATIO
ALDOS/RENIN RATIO: 3.4 (ref 0.0–30.0)
ALDOSTERONE: 3.2 ng/dL (ref 0.0–30.0)
Renin: 0.929 ng/mL/hr (ref 0.167–5.380)

## 2018-11-20 LAB — COMPREHENSIVE METABOLIC PANEL
A/G RATIO: 1.7 (ref 1.2–2.2)
ALBUMIN: 4.3 g/dL (ref 3.6–4.6)
ALT: 20 IU/L (ref 0–44)
AST: 26 IU/L (ref 0–40)
Alkaline Phosphatase: 72 IU/L (ref 39–117)
BILIRUBIN TOTAL: 0.3 mg/dL (ref 0.0–1.2)
BUN / CREAT RATIO: 13 (ref 10–24)
BUN: 12 mg/dL (ref 8–27)
CHLORIDE: 103 mmol/L (ref 96–106)
CO2: 19 mmol/L — ABNORMAL LOW (ref 20–29)
Calcium: 8.9 mg/dL (ref 8.6–10.2)
Creatinine, Ser: 0.9 mg/dL (ref 0.76–1.27)
GFR calc non Af Amer: 80 mL/min/{1.73_m2} (ref >59)
GFR, EST AFRICAN AMERICAN: 92 mL/min/{1.73_m2} (ref >59)
Globulin, Total: 2.6 g/dL (ref 1.5–4.5)
Glucose: 111 mg/dL — ABNORMAL HIGH (ref 65–99)
POTASSIUM: 4.1 mmol/L (ref 3.5–5.2)
Sodium: 140 mmol/L (ref 134–144)
TOTAL PROTEIN: 6.9 g/dL (ref 6.0–8.5)

## 2018-11-20 LAB — LIPID PANEL
Chol/HDL Ratio: 3.8 ratio (ref 0.0–5.0)
Cholesterol, Total: 162 mg/dL (ref 100–199)
HDL: 43 mg/dL (ref >39)
LDL Calculated: 108 mg/dL — ABNORMAL HIGH (ref 0–99)
Triglycerides: 57 mg/dL (ref 0–149)
VLDL CHOLESTEROL CAL: 11 mg/dL (ref 5–40)

## 2018-11-24 ENCOUNTER — Telehealth: Payer: Self-pay

## 2018-11-24 MED ORDER — PRAVASTATIN SODIUM 20 MG PO TABS: 20.0000 mg | ORAL_TABLET | Freq: Every evening | ORAL | 2 refills | Status: DC

## 2018-11-24 NOTE — Telephone Encounter (Signed)
Patient informed of results.  Patient advised to start pravastatin 20mg  once daily.  Rx sent to pharmacy. Patient agreed to plan and verbalized understanding.

## 2018-12-18 ENCOUNTER — Telehealth: Payer: Self-pay | Admitting: Cardiology

## 2018-12-18 NOTE — Telephone Encounter (Signed)
Stop pravastatin, if it is the cause should quickly resolve, call back report 2 weeks

## 2018-12-18 NOTE — Telephone Encounter (Signed)
Patient has been put on Pravastatin.  Patient states he is having a lot of pain in his back, near the kidney area since going on this medicine. Patient wants to know if he should keep taking or stop the medication.  Please call patient and advise

## 2018-12-18 NOTE — Telephone Encounter (Signed)
Phoned patient, instructed to stop pravastatin. Informed that if pravastatin is cause of pain, it should resolve quickly. Dr. Bettina Gavia requests that he call us in 2 weeks to let us know how he's doing/that status of his back pain. Patient agrees to do this.

## 2018-12-18 NOTE — Telephone Encounter (Signed)
Phoned patient to assess report of back pain since starting pravastatin. Patient states he began having right lower back pain just above hip soon after starting pravastatin. He says its so bad he can barely stand up.   He incidentally mentioned that he is not taking his lasix as ordered (20 mg twice daily) because it doesn't work (he doesn't urinate much at all) so he takes both doses (40 mg) once daily in the morning. He said he still has some edema to ankles but less than before lasix was started. States his weight is the same every day.  Pls advise

## 2019-02-05 ENCOUNTER — Telehealth: Payer: Self-pay | Admitting: Cardiology

## 2019-02-05 NOTE — Telephone Encounter (Signed)
Virtual Visit Pre-Appointment Phone Call  "(Name), I am calling you today to discuss your upcoming appointment. We are currently trying to limit exposure to the virus that causes COVID-19 by seeing patients at home rather than in the office."  1. "What is the BEST phone number to call the day of the visit?" - include this in appointment notes  2. Do you have or have access to (through a family member/friend) a smartphone with video capability that we can use for your visit?" a. If yes - list this number in appt notes as cell (if different from BEST phone #) and list the appointment type as a VIDEO visit in appointment notes b. If no - list the appointment type as a PHONE visit in appointment notes  3. Confirm consent - "In the setting of the current Covid19 crisis, you are scheduled for a (phone or video) visit with your provider on (date) at (time).  Just as we do with many in-office visits, in order for you to participate in this visit, we must obtain consent.  If you'd like, I can send this to your mychart (if signed up) or email for you to review.  Otherwise, I can obtain your verbal consent now.  All virtual visits are billed to your insurance company just like a normal visit would be.  By agreeing to a virtual visit, we'd like you to understand that the technology does not allow for your provider to perform an examination, and thus may limit your provider's ability to fully assess your condition. If your provider identifies any concerns that need to be evaluated in person, we will make arrangements to do so.  Finally, though the technology is pretty good, we cannot assure that it will always work on either your or our end, and in the setting of a video visit, we may have to convert it to a phone-only visit.  In either situation, we cannot ensure that we have a secure connection.  Are you willing to proceed?" STAFF: Did the patient verbally acknowledge consent to telehealth visit? Document  YES/NO here: Yes per pt  4. Advise patient to be prepared - "Two hours prior to your appointment, go ahead and check your blood pressure, pulse, oxygen saturation, and your weight (if you have the equipment to check those) and write them all down. When your visit starts, your provider will ask you for this information. If you have an Apple Watch or Kardia device, please plan to have heart rate information ready on the day of your appointment. Please have a pen and paper handy nearby the day of the visit as well."  5. Give patient instructions for MyChart download to smartphone OR Doximity/Doxy.me as below if video visit (depending on what platform provider is using)  6. Inform patient they will receive a phone call 15 minutes prior to their appointment time (may be from unknown caller ID) so they should be prepared to answer    TELEPHONE CALL NOTE  [facility] Andrade has been deemed a candidate for a follow-up tele-health visit to limit community exposure during the Covid-19 pandemic. I spoke with the patient via phone to ensure availability of phone/video source, confirm preferred email & phone number, and discuss instructions and expectations.  I reminded Benjamin Andrade to be prepared with any vital sign and/or heart rhythm information that could potentially be obtained via home monitoring, at the time of his visit. I reminded Benjamin Andrade to expect a phone call prior to his  visit.  Benjamin Andrade 02/05/2019 2:02 PM   INSTRUCTIONS FOR DOWNLOADING THE MYCHART APP TO SMARTPHONE  - The patient must first make sure to have activated MyChart and know their login information - If Apple, go to [employer] and type in MyChart in the search bar and download the app. If Android, ask patient to go to [employer] and type in [place] in the search bar and download the app. The app is free but as with any other app downloads, their phone may require them to verify saved payment information or Apple/Android  password.  - The patient will need to then log into the app with their MyChart username and password, and select [facility] as their healthcare provider to link the account. When it is time for your visit, go to the MyChart app, find appointments, and click Begin Video Visit. Be sure to Select Allow for your device to access the Microphone and Camera for your visit. You will then be connected, and your provider will be with you shortly.  **If they have any issues connecting, or need assistance please contact MyChart service desk [phone])**  **If using a computer, in order to ensure the best quality for their visit they will need to use either of the following Internet Browsers: [employer], or Google Chrome**  IF USING DOXIMITY or DOXY.ME - The patient will receive a link just prior to their visit by text.     FULL LENGTH CONSENT FOR TELE-HEALTH VISIT   I hereby voluntarily request, consent and authorize [facility] and its employed or contracted physicians, physician assistants, nurse practitioners or other licensed health care professionals (the Practitioner), to provide me with telemedicine health care services (the Services") as deemed necessary by the treating Practitioner. I acknowledge and consent to receive the Services by the Practitioner via telemedicine. I understand that the telemedicine visit will involve communicating with the Practitioner through live audiovisual communication technology and the disclosure of certain medical information by electronic transmission. I acknowledge that I have been given the opportunity to request an in-person assessment or other available alternative prior to the telemedicine visit and am voluntarily participating in the telemedicine visit.  I understand that I have the right to withhold or withdraw my consent to the use of telemedicine in the course of my care at any time, without affecting my right to future care or treatment,  and that the Practitioner or I may terminate the telemedicine visit at any time. I understand that I have the right to inspect all information obtained and/or recorded in the course of the telemedicine visit and may receive copies of available information for a reasonable fee.  I understand that some of the potential risks of receiving the Services via telemedicine include:   Delay or interruption in medical evaluation due to technological equipment failure or disruption;  Information transmitted may not be sufficient (e.g. poor resolution of images) to allow for appropriate medical decision making by the Practitioner; and/or   In rare instances, security protocols could fail, causing a breach of personal health information.  Furthermore, I acknowledge that it is my responsibility to provide information about my medical history, conditions and care that is complete and accurate to the best of my ability. I acknowledge that Practitioner's advice, recommendations, and/or decision may be based on factors not within their control, such as incomplete or inaccurate data provided by me or distortions of diagnostic images or specimens that may result from electronic transmissions. I understand  that the practice of medicine is not an exact science and that Practitioner makes no warranties or guarantees regarding treatment outcomes. I acknowledge that I will receive a copy of this consent concurrently upon execution via email to the email address I last provided but may also request a printed copy by calling the office of [facility].    I understand that my insurance will be billed for this visit.   I have read or had this consent read to me.  I understand the contents of this consent, which adequately explains the benefits and risks of the Services being provided via telemedicine.   I have been provided ample opportunity to ask questions regarding this consent and the Services and have had my questions  answered to my satisfaction.  I give my informed consent for the services to be provided through the use of telemedicine in my medical care  By participating in this telemedicine visit I agree to the above.

## 2019-02-09 ENCOUNTER — Encounter: Payer: Self-pay | Admitting: Cardiology

## 2019-02-09 ENCOUNTER — Telehealth (INDEPENDENT_AMBULATORY_CARE_PROVIDER_SITE_OTHER): Payer: Medicare Other | Admitting: Cardiology

## 2019-02-09 ENCOUNTER — Other Ambulatory Visit: Payer: Self-pay

## 2019-02-09 VITALS — BP 137/84 | HR 44 | Ht 68.0 in | Wt 220.0 lb

## 2019-02-09 DIAGNOSIS — E782 Mixed hyperlipidemia: Secondary | ICD-10-CM

## 2019-02-09 DIAGNOSIS — R001 Bradycardia, unspecified: Secondary | ICD-10-CM | POA: Insufficient documentation

## 2019-02-09 DIAGNOSIS — I10 Essential (primary) hypertension: Secondary | ICD-10-CM

## 2019-02-09 DIAGNOSIS — I495 Sick sinus syndrome: Secondary | ICD-10-CM | POA: Insufficient documentation

## 2019-02-09 DIAGNOSIS — I25119 Atherosclerotic heart disease of native coronary artery with unspecified angina pectoris: Secondary | ICD-10-CM

## 2019-02-09 HISTORY — DX: Bradycardia, unspecified: R00.1

## 2019-02-09 MED ORDER — CLONIDINE HCL 0.1 MG PO TABS: 0.1000 mg | ORAL_TABLET | Freq: Every day | ORAL | 1 refills | Status: DC

## 2019-02-09 NOTE — Patient Instructions (Addendum)
Medication Instructions:  Your physician has recommended you make the following change in your medication:   INCREASE clonidine (catapres) 0.1 mg: Take 1 tablet daily at bedtime. Take 1 extra tablet in the morning if your systolic BP (top number) is 160 or greater  If you need a refill on your cardiac medications before your next appointment, please call your pharmacy.   Lab work: None  If you have labs (blood work) drawn today and your tests are completely normal, you will receive your results only by: Marland Kitchen MyChart Message (if you have MyChart) OR . A paper copy in the mail If you have any lab test that is abnormal or we need to change your treatment, we will call you to review the results.  Testing/Procedures: Your physician has recommended that you wear a ZIO monitor. ZIO monitors are medical devices that record the heart's electrical activity. Doctors most often use these monitors to diagnose arrhythmias. Arrhythmias are problems with the speed or rhythm of the heartbeat. The monitor is a small, portable device. You can wear one while you do your normal daily activities. This is usually used to diagnose what is causing palpitations/syncope (passing out). Wear for 7 days.   Follow-Up: At [facility], you and your health needs are our priority.  As part of our continuing mission to provide you with exceptional heart care, we have created designated Provider Care Teams.  These Care Teams include your primary Cardiologist (physician) and Advanced Practice Providers (APPs -  Physician Assistants and Nurse Practitioners) who all work together to provide you with the care you need, when you need it. You will need a telephone follow up appointment in 6 weeks: Tuesday, 03/23/2019, at 10:40 am.

## 2019-02-09 NOTE — Progress Notes (Signed)
Virtual Visit via Telephone Note   This visit type was conducted due to national recommendations for restrictions regarding the COVID-19 Pandemic (e.g. social distancing) in an effort to limit this patient's exposure and mitigate transmission in our community.  Due to his co-morbid illnesses, this patient is at least at moderate risk for complications without adequate follow up.  This format is felt to be most appropriate for this patient at this time.  The patient did not have access to video technology/had technical difficulties with video requiring transitioning to audio format only (telephone).  All issues noted in this document were discussed and addressed.  No physical exam could be performed with this format.  Please refer to the patient's chart for his  consent to telehealth for [facility].   Date:  02/09/2019   ID:  Benjamin Andrade, DOB 1935-01-05, MRN [id number]  Patient Location: Home Provider Location: Office  PCP:  Myrlene Broker, MD  Cardiologist:  Shirlee More, MD  Electrophysiologist:  None   Evaluation Performed:  Follow-Up Visit  Chief Complaint:  BP control  History of Present Illness:    Benjamin Andrade is a 84 y.o. male with CAD S/P PCI in 2014 with DES to [facility] in 2014 Dyslipidemia mild carotid atherosclerosis and  resistant HTN last seen 11/04/18.  At that time his blood pressure remained poorly controlled and we had added a potent ARB as well as low-dose clonidine to achieve control.  He was screened for secondary hyperaldosteronism and has a normal aldosterone to renin ratio.  Renal function is normal with a GFR of 80 cc.  The patient does not have symptoms concerning for COVID-19 infection (fever, chills, cough, or new shortness of breath).   Overall he is doing better he has been getting blood pressures at home in the range of 1 56-1 63 over 80s.  He has had palpitation at times he gets heart rates of less than 50.  We will continue his current multi drug regimen  for hypertension he will take an extra clonidine in the mornings for systolics greater than 782 and will utilize a 7-day extended ZIO monitor to assess heart rate as we may need to attenuate his beta-blocker which is difficult to do with poorly controlled and resistant hypertension.  He has had no edema shortness of breath or syncope. Past Medical History:  Diagnosis Date  . Arthritis   . Cancer ([place])    skin-lip-face  . Carotid stenosis, bilateral 05/09/2016  . Coronary artery disease   . Coronary artery disease involving native coronary artery of native heart with angina pectoris ([place]) 08/22/2015   Overview:  80% CX - DES oct 2014 80% CX - DES oct 2014  . Diabetes mellitus without complication (HCC)    prediabetic  . Dysrhythmia    occ  . Essential hypertension 08/22/2015  . GERD (gastroesophageal reflux disease)   . GERD without esophagitis 05/09/2017  . H/O heart artery stent 05/26/2017  . Hyperlipemia 08/22/2015  . Hypertension   . Myocardial infarction ([place])   . Type 2 diabetes mellitus with hyperglycemia, without long-term current use of insulin ([place]) 05/09/2017   Past Surgical History:  Procedure Laterality Date  . BACK SURGERY    . CAROTID ENDARTERECTOMY Left   . CATARACT EXTRACTION    . INGUINAL HERNIA REPAIR    . KNEE ARTHROSCOPY Right   . REVERSE SHOULDER ARTHROPLASTY Right 03/28/2016   Procedure: REVERSE SHOULDER ARTHROPLASTY;  Surgeon: Meredith Pel, MD;  Location: [place];  Service: Orthopedics;  Laterality: Right;  . SHOULDER ARTHROSCOPY W/ ROTATOR CUFF REPAIR Right   . SKIN BIOPSY       No outpatient medications have been marked as taking for the 02/09/19 encounter (Appointment) with Richardo Priest, MD.     Allergies:   No known allergies   Social History   Tobacco Use  . Smoking status: Former Smoker    Packs/day: 2.00    Years: 49.00    Pack years: 98.00    Types: Cigarettes    Last attempt to quit: 03/22/1988    Years since quitting: 30.9  . Smokeless  tobacco: Never Used  Substance Use Topics  . Alcohol use: No  . Drug use: No     Family Hx: The patient's family history includes Cancer in his father, mother, sister, and sister; Coronary artery disease in his sister.  ROS:   Please see the history of present illness.     All other systems reviewed and are negative.   Prior CV studies:   The following studies were reviewed today:  Carotid duplex 07/06/18:    Right Carotid: Velocities in the right ICA are consistent with a 1-39% stenosis.                The extracranial vessels were near-normal with only minimal wall                thickening or plaque.   Left Carotid: Velocities in the left ICA are consistent with a 1-39% stenosis.               The extracranial vessels were near-normal with only minimal wall               thickening or plaque.   Vertebrals:  Bilateral vertebral arteries demonstrate antegrade flow. Subclavians: Normal flow hemodynamics were seen in bilateral subclavian              arteries.   Labs/Other Tests and Data Reviewed:    EKG:  No ECG reviewed.  Recent Labs: 11/12/2018: ALT 20; BUN 12; Creatinine, Ser 0.90; Potassium 4.1; Sodium 140   Recent Lipid Panel Lab Results  Component Value Date/Time   CHOL 162 11/12/2018 08:33 AM   TRIG 57 11/12/2018 08:33 AM   HDL 43 11/12/2018 08:33 AM   CHOLHDL 3.8 11/12/2018 08:33 AM   LDLCALC 108 (H) 11/12/2018 08:33 AM    Wt Readings from Last 3 Encounters:  11/04/18 226 lb 6.4 oz (102.7 kg)  05/22/18 216 lb 12.8 oz (98.3 kg)  03/28/16 219 lb (99.3 kg)     Objective:    Vital Signs:  There were no vitals taken for this visit.   VITAL SIGNS:  reviewed he Is alert and oriented x3 mood affect thought cognition are normal and no audible wheezing or respiratory distress during conversation  ASSESSMENT & PLAN:    1. Hypertension is improved continue current regimen, recent lab test showed no evidence of adrenal excess. 2. CAD stable continue medical  treatment 3. Hyperlipidemia stable continue high intensity statin with CAD 4. Bradycardia, utilize a 7-day monitor to see if we need to reduce the dose of carvedilol which is difficult with poorly controlled hypertension  COVID-19 Education: The signs and symptoms of COVID-19 were discussed with the patient and how to seek care for testing (follow up with PCP or arrange E-visit).  The importance of social distancing was discussed today.  Time:   Today, I have spent 15 minutes with the  patient with telehealth technology discussing the above problems.     Medication Adjustments/Labs and Tests Ordered: Current medicines are reviewed at length with the patient today.  Concerns regarding medicines are outlined above.   Tests Ordered: No orders of the defined types were placed in this encounter.   Medication Changes: No orders of the defined types were placed in this encounter.   Disposition:  Follow up in 6 week(s)  Signed, Shirlee More, MD  02/09/2019 12:22 PM    [facility]

## 2019-02-09 NOTE — Addendum Note (Signed)
Addended by: Austin Miles on: 02/09/2019 03:11 PM   Modules accepted: Orders

## 2019-02-16 ENCOUNTER — Other Ambulatory Visit (INDEPENDENT_AMBULATORY_CARE_PROVIDER_SITE_OTHER): Payer: Medicare Other

## 2019-02-16 DIAGNOSIS — R001 Bradycardia, unspecified: Secondary | ICD-10-CM | POA: Diagnosis not present

## 2019-03-09 ENCOUNTER — Telehealth: Payer: Self-pay

## 2019-03-09 MED ORDER — CARVEDILOL 25 MG PO TABS: ORAL_TABLET | ORAL | Status: DC

## 2019-03-09 NOTE — Telephone Encounter (Signed)
-----   Message from Richardo Priest, MD sent at 03/09/2019 10:15 AM EDT ----- Normal or stable result  Decrease his AM carvedilol to 12.5 mg

## 2019-03-09 NOTE — Telephone Encounter (Signed)
Patient informed of results and advised to decreased carvedilol 25mg  to 12.5mg  in the morning and 25mg  in the evening.  Patient agreed to plan and verbalized understanding.

## 2019-03-22 NOTE — Progress Notes (Signed)
Cardiology Office Note:    Date:  03/23/2019   ID:  Benjamin Andrade, DOB 11-Sep-1935, MRN [id number]  PCP:  Benjamin Broker, MD  Cardiologist:  Benjamin More, MD    Referring MD: Benjamin Broker, MD      Virtual Visit via Telephone Note   This visit type was conducted due to national recommendations for restrictions regarding the COVID-19 Pandemic (e.g. social distancing) in an effort to limit this patient's exposure and mitigate transmission in our community.  Due to his co-morbid illnesses, this patient is at least at moderate risk for complications without adequate follow up.  This format is felt to be most appropriate for this patient at this time.  The patient did not have access to video technology/had technical difficulties with video requiring transitioning to audio format only (telephone).  All issues noted in this document were discussed and addressed.  No physical exam could be performed with this format.  Please refer to the patient's chart for his  consent to telehealth for Benjamin Andrade. ASSESSMENT:    1. Resistant hypertension   2. Coronary artery disease involving native coronary artery of native heart with angina pectoris (Benjamin Andrade)   3. Bradycardia   4. Mixed hyperlipidemia    PLAN:    In order of problems listed above:  1. Resistant hypertension markedly improved, he is quite compliant interest in his care will continue his multidrug regimen including beta-blocker carvedilol long-acting calcium channel blocker centrally active clonidine loop diuretic and potent ARB.  If he cannot achieve target minoxidil be appropriate. 2. Coronary artery disease stable [place] Heart Association class I continue current treatment including statin beta-blocker calcium channel blocker and dual antiplatelet therapy.  Has had no bleeding complications I would continue the same 3. Bradycardia stable on event monitor continue his beta-blocker the needs for hypertension control he is asymptomatic  4. Hyperlipidemia stable continue a statin with CAD in his case I think his LDL cholesterol is at target 5. Typical symptoms of carpal tunnel disrupting his life refer to orthopedic surgery for evaluation and treatment in the interim I asked him to get a splint to wear at nighttime on the affected left arm 6. Abdominal aortic aneurysm small has not progressed 7. Enlargement thoracic aorta stable plan to do a follow-up CT in 1 to 2 years  Next appointment: 6 months  Medication Adjustments/Labs and Tests Ordered: Current medicines are reviewed at length with the patient today.  Concerns regarding medicines are outlined above.  No orders of the defined types were placed in this encounter.  No orders of the defined types were placed in this encounter.  FU after tests and for hypertension  History of Present Illness:    Benjamin Andrade is a 84 y.o. male with a hx of  CAD S/P PCI in 2014 with DES to Benjamin Andrade in 2014 Dyslipidemia mild carotid atherosclerosis and  resistant HTN  last seen 02/09/2019.  Compliance with diet, lifestyle and medications: yes  He is doing better home blood pressures consistently less than 140/80.  Compliant with medications sodium restricts no edema shortness of breath chest pain palpitation or syncope he is terribly bothered by hand pain very typical for carpal tunnel worse at night worse driving the car spares the thumb and is relieved by shaking the hands worse on the left than the right.  At his request I will refer to orthopedic surgery Dr. Samule Dry.  I reviewed his recent event monitor no need for pacemaker duplex of abdominal aorta  no progression in size and mild enlargement of thoracic aorta on CT scan he is committed to compliance and control of his hypertension will continue to home monitor.  Recent lipid profile at target  With a new problem of carpal tunnel syndrome ongoing problem resistant hypertension CAD hyperlipidemia the time to discuss the results of his event  monitor CT scan and duplex of abdominal aorta and formulate a treatment plan 25 minutes was spent with the patient. Past Medical History:  Diagnosis Date  . Arthritis   . Cancer (Benjamin Andrade)    skin-lip-face  . Carotid stenosis, bilateral 05/09/2016  . Coronary artery disease   . Coronary artery disease involving native coronary artery of native heart with angina pectoris (Benjamin Andrade) 08/22/2015   Overview:  80% CX - DES oct 2014 80% CX - DES oct 2014  . Diabetes mellitus without complication (HCC)    prediabetic  . Dysrhythmia    occ  . Essential hypertension 08/22/2015  . GERD (gastroesophageal reflux disease)   . GERD without esophagitis 05/09/2017  . H/O heart artery stent 05/26/2017  . Hyperlipemia 08/22/2015  . Hypertension   . Myocardial infarction ([place])   . Type 2 diabetes mellitus with hyperglycemia, without long-term current use of insulin (Benjamin Andrade) 05/09/2017    Past Surgical History:  Procedure Laterality Date  . BACK SURGERY    . CAROTID ENDARTERECTOMY Left   . CATARACT EXTRACTION    . INGUINAL HERNIA REPAIR    . KNEE ARTHROSCOPY Right   . REVERSE SHOULDER ARTHROPLASTY Right 03/28/2016   Procedure: REVERSE SHOULDER ARTHROPLASTY;  Surgeon: Meredith Pel, MD;  Location: [place];  Service: Orthopedics;  Laterality: Right;  . SHOULDER ARTHROSCOPY W/ ROTATOR CUFF REPAIR Right   . SKIN BIOPSY      Current Medications: Current Meds  Medication Sig  . albuterol (PROVENTIL HFA;VENTOLIN HFA) 108 (90 Base) MCG/ACT inhaler Inhale 2 puffs into the lungs every 6 (six) hours as needed for wheezing.  Marland Kitchen amLODipine (NORVASC) 5 MG tablet Take 1 tablet by mouth daily.  Marland Kitchen aspirin EC 81 MG tablet Take 81 mg by mouth daily.  . carvedilol (COREG) 25 MG tablet Take 1/2 tablet (12.5mg ) in the morning and 1 tablet (25mg ) in the afternoon  . celecoxib (CELEBREX) 200 MG capsule Take 1 capsule by mouth daily.  . cloNIDine (CATAPRES) 0.1 MG tablet Take 1 tablet (0.1 mg total) by mouth at bedtime. Take 1 extra  tablet in the morning if your systolic BP (top number) is 160 or greater.  . clopidogrel (PLAVIX) 75 MG tablet Take 75 mg by mouth every morning.   . colchicine 0.6 MG tablet Take 0.6 mg by mouth daily as needed.   . furosemide (LASIX) 20 MG tablet Take 20 mg by mouth as directed. Takes 1 tablet my mouth daily and twice daily as needed for swelling  . indomethacin (INDOCIN) 50 MG capsule Take 1 capsule by mouth daily as needed.  Marland Kitchen ipratropium-albuterol (DUONEB) 0.5-2.5 (3) MG/3ML SOLN Inhale 3 mLs into the lungs every 6 (six) hours as needed.  . metFORMIN (GLUCOPHAGE) 500 MG tablet Take 500 mg by mouth daily as needed (Per sliding scale). Reported on 03/21/2016  . Multiple Vitamin (MULTI-VITAMINS) TABS Take 1 tablet by mouth every morning.   . pantoprazole (PROTONIX) 40 MG tablet Take 40 mg by mouth every morning.   Marland Kitchen telmisartan (MICARDIS) 40 MG tablet Take 1 tablet (40 mg total) by mouth daily.  . traZODone (DESYREL) 150 MG tablet Take 1 tablet by  mouth at bedtime as needed.      Allergies:   No known allergies   Social History   Socioeconomic History  . Marital status: Married    Spouse name: Not on file  . Number of children: Not on file  . Years of education: Not on file  . Highest education level: Not on file  Occupational History  . Not on file  Social Needs  . Financial resource strain: Not on file  . Food insecurity    Worry: Not on file    Inability: Not on file  . Transportation needs    Medical: Not on file    Non-medical: Not on file  Tobacco Use  . Smoking status: Former Smoker    Packs/day: 2.00    Years: 49.00    Pack years: 98.00    Types: Cigarettes    Quit date: 03/22/1988    Years since quitting: 31.0  . Smokeless tobacco: Never Used  Substance and Sexual Activity  . Alcohol use: No  . Drug use: No  . Sexual activity: Not on file  Lifestyle  . Physical activity    Days per week: Not on file    Minutes per session: Not on file  . Stress: Not on file   Relationships  . Social Herbalist on phone: Not on file    Gets together: Not on file    Attends religious service: Not on file    Active member of club or organization: Not on file    Attends meetings of clubs or organizations: Not on file    Relationship status: Not on file  Other Topics Concern  . Not on file  Social History Narrative  . Not on file     Family History: The patient's family history includes Cancer in his father, mother, sister, and sister; Coronary artery disease in his sister. ROS:   Please see the history of present illness.    All other systems reviewed and are negative.  EKGs/Labs/Other Studies Reviewed:    The following studies were reviewed today:   Study Highlights ZIO monitor was utilized for 4 days 9 hours initiating 02/14/2019 to assess bradycardia. The rhythm throughout is sinus with minimum average and maximum heart rates of 37, 57 and 83 bpm. There were no pauses of 3 seconds or greater, the longest 2.6 seconds.  Daytime bradycardia occurred with brief 29 bpm and 37 bpm.  This was sinus bradycardia.  There are no episodes of sinus node or AV block. Ventricular ectopy was rare PVCs couplets and 1 triplet. Supraventricular ectopy was rare however there were 21 episodes of atrial tachycardia the longest 16 seconds at a rate of 113 bpm.  There were no episodes of atrial fibrillation or flutter. There are no triggered or symptomatic events. Conclusion, rare ventricular and supraventricular ectopy with brief episodes of atrial tachycardia.  Daytime sinus bradycardia is noted.   Recent Labs: 11/12/2018: ALT 20; BUN 12; Creatinine, Ser 0.90; Potassium 4.1; Sodium 140   Recent Lipid Panel    Component Value Date/Time   CHOL 162 11/12/2018 0833   TRIG 57 11/12/2018 0833   HDL 43 11/12/2018 0833   CHOLHDL 3.8 11/12/2018 0833   LDLCALC 108 (H) 11/12/2018 0833    Physical Exam:    VS:  Wt 226 lb (102.5 kg)   BMI 34.36 kg/m     Wt  Readings from Last 3 Encounters:  03/23/19 226 lb (102.5 kg)  02/09/19 220 lb (99.8  kg)  11/04/18 226 lb 6.4 oz (102.7 kg)    Home BP runs , 140/90  Vital signs reviewed  Signed, Benjamin More, MD  03/23/2019 10:47 AM    [place]

## 2019-03-23 ENCOUNTER — Other Ambulatory Visit: Payer: Self-pay

## 2019-03-23 ENCOUNTER — Encounter: Payer: Self-pay | Admitting: Cardiology

## 2019-03-23 ENCOUNTER — Telehealth (INDEPENDENT_AMBULATORY_CARE_PROVIDER_SITE_OTHER): Payer: Medicare Other | Admitting: Cardiology

## 2019-03-23 VITALS — BP 147/83 | Wt 226.0 lb

## 2019-03-23 DIAGNOSIS — G5603 Carpal tunnel syndrome, bilateral upper limbs: Secondary | ICD-10-CM

## 2019-03-23 DIAGNOSIS — I10 Essential (primary) hypertension: Secondary | ICD-10-CM

## 2019-03-23 DIAGNOSIS — R001 Bradycardia, unspecified: Secondary | ICD-10-CM | POA: Diagnosis not present

## 2019-03-23 DIAGNOSIS — Z87891 Personal history of nicotine dependence: Secondary | ICD-10-CM

## 2019-03-23 DIAGNOSIS — I25119 Atherosclerotic heart disease of native coronary artery with unspecified angina pectoris: Secondary | ICD-10-CM

## 2019-03-23 DIAGNOSIS — E782 Mixed hyperlipidemia: Secondary | ICD-10-CM | POA: Diagnosis not present

## 2019-03-23 DIAGNOSIS — Z955 Presence of coronary angioplasty implant and graft: Secondary | ICD-10-CM

## 2019-03-23 NOTE — Patient Instructions (Addendum)
Medication Instructions:  Your physician recommends that you continue on your current medications as directed. Please refer to the Current Medication list given to you today.  If you need a refill on your cardiac medications before your next appointment, please call your pharmacy.   Lab work: None  If you have labs (blood work) drawn today and your tests are completely normal, you will receive your results only by: Marland Kitchen MyChart Message (if you have MyChart) OR . A paper copy in the mail If you have any lab test that is abnormal or we need to change your treatment, we will call you to review the results.  Testing/Procedures: You have been referred to see Dr. Samule Dry with Orthopedic Surgery. You will be contacted to schedule this appointment.   Follow-Up: At [facility], you and your health needs are our priority.  As part of our continuing mission to provide you with exceptional heart care, we have created designated Provider Care Teams.  These Care Teams include your primary Cardiologist (physician) and Advanced Practice Providers (APPs -  Physician Assistants and Nurse Practitioners) who all work together to provide you with the care you need, when you need it. You will need a follow up appointment in 6 months.  Please call our office 2 months in advance to schedule this appointment.

## 2019-03-24 NOTE — Addendum Note (Signed)
Addended by: Austin Miles on: 03/24/2019 10:51 AM   Modules accepted: Orders

## 2019-05-27 DIAGNOSIS — Z1211 Encounter for screening for malignant neoplasm of colon: Secondary | ICD-10-CM

## 2019-05-27 HISTORY — DX: Encounter for screening for malignant neoplasm of colon: Z12.11

## 2019-08-16 ENCOUNTER — Telehealth: Payer: Self-pay | Admitting: Cardiology

## 2019-08-16 NOTE — Telephone Encounter (Signed)
Call telmisartin to [facility]

## 2019-09-06 ENCOUNTER — Other Ambulatory Visit: Payer: Self-pay | Admitting: *Deleted

## 2019-09-06 ENCOUNTER — Telehealth: Payer: Self-pay | Admitting: Cardiology

## 2019-09-06 MED ORDER — TELMISARTAN 40 MG PO TABS: 40.0000 mg | ORAL_TABLET | Freq: Every day | ORAL | 2 refills | Status: DC

## 2019-09-06 NOTE — Telephone Encounter (Signed)
Refill sent.

## 2019-09-06 NOTE — Telephone Encounter (Signed)
*  STAT* If patient is at the pharmacy, call can be transferred to refill team.   1. Which medications need to be refilled? (please list name of each medication and dose if known) Telmarsartin 40mg  takes 1 daily   2. Which pharmacy/location (including street and city if local pharmacy) is medication to be sent to? [facility]   3. Do they need a 30 day or 90 day supply? [place]

## 2019-09-24 ENCOUNTER — Ambulatory Visit: Payer: Medicare Other | Admitting: Cardiology

## 2019-10-21 NOTE — Progress Notes (Signed)
Cardiology Office Note:    Date:  10/22/2019   ID:  Benjamin Andrade, DOB 1935-02-22, MRN [id number]  PCP:  Myrlene Broker, MD  Cardiologist:  Shirlee More, MD    Referring MD: Myrlene Broker, MD    ASSESSMENT:    1. Coronary artery disease involving native coronary artery of native heart with angina pectoris ([place])   2. Bradycardia   3. Resistant hypertension   4. Mixed hyperlipidemia    PLAN:    In order of problems listed above:  1. Stable CAD New York Heart Association class I having no angina on current medical treatment will continue his chronic dual antiplatelet therapy beta-blocker and resume statin.  He has mild bradycardia asymptomatic and I would not alter his treatment especially with the difficulty controlling hypertension 2.  mild asymptomatic sinus bradycardia no indication of the need for pacemaker change in medication 3. Previous difficult to control now at target on a multidrug regimen including centrally active clonidine loop diuretic ARB and beta-blocker.  Renal function potassium on several drugs with the potential for hyperkalemia hypokalemia and reduced GFR 4. Is poorly controlled he is unsure why he is not on a statin will resume baseline labs today and we can wait to do a lipid profile with his next visit 5. COVID-19 education he was unsure says he will now accept vaccine   Next appointment: 6 months   Medication Adjustments/Labs and Tests Ordered: Current medicines are reviewed at length with the patient today.  Concerns regarding medicines are outlined above.  No orders of the defined types were placed in this encounter.  No orders of the defined types were placed in this encounter.   Chief Complaint  Patient presents with  . Follow-up  . Coronary Artery Disease  . Hypertension  . Hyperlipidemia    History of Present Illness:    Benjamin Andrade is a 85 y.o. male with a hx of CAD S/P PCI in 2014 with DES to [facility] in 2014 Dyslipidemia mild carotid  atherosclerosis and  resistant HTN  last seen 03/23/2019 for resistant hypertension. Compliance with diet, lifestyle and medications: Yes  In general Benjamin Andrade is doing well unfortunately checks his blood pressure first thing when he gets up in the morning before medications at times greater than [id number] systolic.  I explained to him how to check his blood pressure at home and encouraged him to wait an hour or 2 after his medications in my opinion he is well controlled at this time.  He restrict sodium in general he takes his diuretic 1 time a day he is bradycardic with heart rates around 47-55 at home but he is asymptomatic he feels well he has no exercise intolerance edema shortness of breath syncope or angina.  He is not taking a statin and he just does not know why.  We will check his baseline labs and will restart his statin. Past Medical History:  Diagnosis Date  . Arthritis   . Cancer ([place])    skin-lip-face  . Carotid stenosis, bilateral 05/09/2016  . Coronary artery disease   . Coronary artery disease involving native coronary artery of native heart with angina pectoris ([place]) 08/22/2015   Overview:  80% CX - DES oct 2014 80% CX - DES oct 2014  . Diabetes mellitus without complication (HCC)    prediabetic  . Dysrhythmia    occ  . Essential hypertension 08/22/2015  . GERD (gastroesophageal reflux disease)   . GERD without esophagitis 05/09/2017  . H/O  heart artery stent 05/26/2017  . Hyperlipemia 08/22/2015  . Hypertension   . Myocardial infarction ([place])   . Type 2 diabetes mellitus with hyperglycemia, without long-term current use of insulin ([place]) 05/09/2017    Past Surgical History:  Procedure Laterality Date  . BACK SURGERY    . CAROTID ENDARTERECTOMY Left   . CATARACT EXTRACTION    . INGUINAL HERNIA REPAIR    . KNEE ARTHROSCOPY Right   . REVERSE SHOULDER ARTHROPLASTY Right 03/28/2016   Procedure: REVERSE SHOULDER ARTHROPLASTY;  Surgeon: Meredith Pel, MD;  Location: [place];  Service:  Orthopedics;  Laterality: Right;  . SHOULDER ARTHROSCOPY W/ ROTATOR CUFF REPAIR Right   . SKIN BIOPSY      Current Medications: Current Meds  Medication Sig  . albuterol (PROVENTIL HFA;VENTOLIN HFA) 108 (90 Base) MCG/ACT inhaler Inhale 2 puffs into the lungs every 6 (six) hours as needed for wheezing.  Marland Kitchen aspirin EC 81 MG tablet Take 81 mg by mouth daily.  . carvedilol (COREG) 25 MG tablet Take 1/2 tablet (12.5mg ) in the morning and 1 tablet (25mg ) in the afternoon  . cloNIDine (CATAPRES) 0.1 MG tablet Take 1 tablet (0.1 mg total) by mouth at bedtime. Take 1 extra tablet in the morning if your systolic BP (top number) is 160 or greater.  . clopidogrel (PLAVIX) 75 MG tablet Take 75 mg by mouth every morning.   . colchicine 0.6 MG tablet Take 0.6 mg by mouth daily as needed.   . furosemide (LASIX) 20 MG tablet Take 20 mg by mouth as directed. Takes 1 tablet my mouth daily and twice daily as needed for swelling  . indomethacin (INDOCIN) 50 MG capsule Take 1 capsule by mouth daily as needed.  Marland Kitchen ipratropium-albuterol (DUONEB) 0.5-2.5 (3) MG/3ML SOLN Inhale 3 mLs into the lungs every 6 (six) hours as needed.  . Multiple Vitamin (MULTI-VITAMINS) TABS Take 1 tablet by mouth every morning.   . pantoprazole (PROTONIX) 40 MG tablet Take 40 mg by mouth every morning.   Marland Kitchen telmisartan (MICARDIS) 40 MG tablet Take 1 tablet (40 mg total) by mouth daily.     Allergies:   No known allergies   Social History   Socioeconomic History  . Marital status: Married    Spouse name: Not on file  . Number of children: Not on file  . Years of education: Not on file  . Highest education level: Not on file  Occupational History  . Not on file  Tobacco Use  . Smoking status: Former Smoker    Packs/day: 2.00    Years: 49.00    Pack years: 98.00    Types: Cigarettes    Quit date: 03/22/1988    Years since quitting: 31.6  . Smokeless tobacco: Never Used  Substance and Sexual Activity  . Alcohol use: No  . Drug  use: No  . Sexual activity: Not Currently  Other Topics Concern  . Not on file  Social History Narrative  . Not on file   Social Determinants of Health   Financial Resource Strain:   . Difficulty of Paying Living Expenses: Not on file  Food Insecurity:   . Worried About Charity fundraiser in the Last Year: Not on file  . Ran Out of Food in the Last Year: Not on file  Transportation Needs:   . Lack of Transportation (Medical): Not on file  . Lack of Transportation (Non-Medical): Not on file  Physical Activity:   . Days of Exercise per Week:  Not on file  . Minutes of Exercise per Session: Not on file  Stress:   . Feeling of Stress : Not on file  Social Connections:   . Frequency of Communication with Friends and Family: Not on file  . Frequency of Social Gatherings with Friends and Family: Not on file  . Attends Religious Services: Not on file  . Active Member of Clubs or Organizations: Not on file  . Attends Archivist Meetings: Not on file  . Marital Status: Not on file     Family History: The patient's family history includes Cancer in his father, mother, sister, and sister; Coronary artery disease in his sister. ROS:   Please see the history of present illness.    All other systems reviewed and are negative.  EKGs/Labs/Other Studies Reviewed:    The following studies were reviewed today:  EKG:  EKG ordered today and personally reviewed.  The ekg ordered today demonstrates sinus bradycardia 53 bpm PR interval is normal and there is no conduction delay  Recent Labs: 11/12/2018: ALT 20; BUN 12; Creatinine, Ser 0.90; Potassium 4.1; Sodium 140  Recent Lipid Panel    Component Value Date/Time   CHOL 162 11/12/2018 0833   TRIG 57 11/12/2018 0833   HDL 43 11/12/2018 0833   CHOLHDL 3.8 11/12/2018 0833   LDLCALC 108 (H) 11/12/2018 0833    Physical Exam:    VS:  BP (!) 146/78   Pulse (!) 53   Ht 5\' 8"  (1.727 m)   Wt 231 lb (104.8 kg)   SpO2 97%   BMI  35.12 kg/m     Wt Readings from Last 3 Encounters:  10/22/19 231 lb (104.8 kg)  03/23/19 226 lb (102.5 kg)  02/09/19 220 lb (99.8 kg)     GEN: Overweight well nourished, well developed in no acute distress HEENT: Normal NECK: No JVD; No carotid bruits LYMPHATICS: No lymphadenopathy CARDIAC: RRR, no murmurs, rubs, gallops RESPIRATORY:  Clear to auscultation without rales, wheezing or rhonchi  ABDOMEN: Soft, non-tender, non-distended MUSCULOSKELETAL:  No edema; No deformity  SKIN: Warm and dry NEUROLOGIC:  Alert and oriented x 3 PSYCHIATRIC:  Normal affect    Signed, Shirlee More, MD  10/22/2019 9:39 AM    [place]

## 2019-10-22 ENCOUNTER — Ambulatory Visit (INDEPENDENT_AMBULATORY_CARE_PROVIDER_SITE_OTHER): Payer: Medicare Other | Admitting: Cardiology

## 2019-10-22 ENCOUNTER — Other Ambulatory Visit: Payer: Self-pay

## 2019-10-22 ENCOUNTER — Encounter: Payer: Self-pay | Admitting: Cardiology

## 2019-10-22 VITALS — BP 146/78 | HR 53 | Ht 68.0 in | Wt 231.0 lb

## 2019-10-22 DIAGNOSIS — E782 Mixed hyperlipidemia: Secondary | ICD-10-CM

## 2019-10-22 DIAGNOSIS — I25119 Atherosclerotic heart disease of native coronary artery with unspecified angina pectoris: Secondary | ICD-10-CM | POA: Diagnosis not present

## 2019-10-22 DIAGNOSIS — I10 Essential (primary) hypertension: Secondary | ICD-10-CM

## 2019-10-22 DIAGNOSIS — R001 Bradycardia, unspecified: Secondary | ICD-10-CM

## 2019-10-22 MED ORDER — PRAVASTATIN SODIUM 20 MG PO TABS: 20.0000 mg | ORAL_TABLET | Freq: Every evening | ORAL | 2 refills | Status: DC

## 2019-10-22 NOTE — Patient Instructions (Signed)
Medication Instructions:  Your physician has recommended you make the following change in your medication:  RESTART pravastatin 20 (1 tablet) once daily *If you need a refill on your cardiac medications before your next appointment, please call your pharmacy*  Lab Work: Your physician recommends that you have a lipid, BMP and BNP drawn today If you have labs (blood work) drawn today and your tests are completely normal, you will receive your results only by: Marland Kitchen MyChart Message (if you have MyChart) OR . A paper copy in the mail If you have any lab test that is abnormal or we need to change your treatment, we will call you to review the results.  Testing/Procedures: You had an EKG performed today  Follow-Up: At [facility], you and your health needs are our priority.  As part of our continuing mission to provide you with exceptional heart care, we have created designated Provider Care Teams.  These Care Teams include your primary Cardiologist (physician) and Advanced Practice Providers (APPs -  Physician Assistants and Nurse Practitioners) who all work together to provide you with the care you need, when you need it.  Your next appointment:   6 month(s)  The format for your next appointment:   In Person  Provider:   Shirlee More, MD  Other Instructions Pravastatin tablets What is this medicine? PRAVASTATIN (PRA va stat in) is known as a HMG-CoA reductase inhibitor or 'statin'. It lowers the level of cholesterol and triglycerides in the blood. This drug may also reduce the risk of heart attack, stroke, or other health problems in patients with risk factors for heart disease. Diet and lifestyle changes are often used with this drug. This medicine may be used for other purposes; ask your health care provider or pharmacist if you have questions. COMMON BRAND NAME(S): Pravachol What should I tell my health care provider before I take this medicine? They need to know if you have any of  these conditions:  diabetes  if you often drink alcohol  history of stroke  kidney disease  liver disease  muscle aches or weakness  thyroid disease  an unusual or allergic reaction to pravastatin, other medicines, foods, dyes, or preservatives  pregnant or trying to get pregnant  breast-feeding How should I use this medicine? Take pravastatin tablets by mouth. Swallow the tablets with a drink of water. Pravastatin can be taken at anytime of the day, with or without food. Follow the directions on the prescription label. Take your doses at regular intervals. Do not take your medicine more often than directed. Talk to your pediatrician regarding the use of this medicine in children. Special care may be needed. Pravastatin has been used in children as young as 76 years of age. Overdosage: If you think you have taken too much of this medicine contact a poison control center or emergency room at once. NOTE: This medicine is only for you. Do not share this medicine with others. What if I miss a dose? If you miss a dose, take it as soon as you can. If it is almost time for your next dose, take only that dose. Do not take double or extra doses. What may interact with this medicine? This medicine may interact with the following medications:  colchicine  cyclosporine  other medicines for high cholesterol  some antibiotics like azithromycin, clarithromycin, erythromycin, and telithromycin This list may not describe all possible interactions. Give your health care provider a list of all the medicines, herbs, non-prescription drugs, or dietary  supplements you use. Also tell them if you smoke, drink alcohol, or use illegal drugs. Some items may interact with your medicine. What should I watch for while using this medicine? Visit your doctor or health care professional for regular check-ups. You may need regular tests to make sure your liver is working properly. Your health care professional  may tell you to stop taking this medicine if you develop muscle problems. If your muscle problems do not go away after stopping this medicine, contact your health care professional. Do not become pregnant while taking this medicine. Women should inform their health care professional if they wish to become pregnant or think they might be pregnant. There is a potential for serious side effects to an unborn child. Talk to your health care professional or pharmacist for more information. Do not breast-feed an infant while taking this medicine. This medicine may affect blood sugar levels. If you have diabetes, check with your doctor or health care professional before you change your diet or the dose of your diabetic medicine. If you are going to need surgery or other procedure, tell your doctor that you are using this medicine. This drug is only part of a total heart-health program. Your doctor or a dietician can suggest a low-cholesterol and low-fat diet to help. Avoid alcohol and smoking, and keep a proper exercise schedule. This medicine may cause a decrease in Co-Enzyme Q-10. You should make sure that you get enough Co-Enzyme Q-10 while you are taking this medicine. Discuss the foods you eat and the vitamins you take with your health care professional. What side effects may I notice from receiving this medicine? Side effects that you should report to your doctor or health care professional as soon as possible:  allergic reactions like skin rash, itching or hives, swelling of the face, lips, or tongue  dark urine  fever  muscle pain, cramps, or weakness  redness, blistering, peeling or loosening of the skin, including inside the mouth  trouble passing urine or change in the amount of urine  unusually weak or tired  yellowing of the eyes or skin Side effects that usually do not require medical attention (report to your doctor or health care professional if they continue or are  bothersome):  gas  headache  heartburn  indigestion  stomach pain This list may not describe all possible side effects. Call your doctor for medical advice about side effects. You may report side effects to FDA at 1-800-FDA-1088. Where should I keep my medicine? Keep out of the reach of children. Store at room temperature between 15 to 30 degrees C (59 to 86 degrees F). Protect from light. Keep container tightly closed. Throw away any unused medicine after the expiration date. NOTE: This sheet is a summary. It may not cover all possible information. If you have questions about this medicine, talk to your doctor, pharmacist, or health care provider.  2020 Elsevier/Gold Standard (2017-05-06 12:37:09)

## 2019-10-23 LAB — COMPREHENSIVE METABOLIC PANEL
ALT: 21 IU/L (ref 0–44)
AST: 24 IU/L (ref 0–40)
Albumin/Globulin Ratio: 1.8 (ref 1.2–2.2)
Albumin: 4.2 g/dL (ref 3.6–4.6)
Alkaline Phosphatase: 65 IU/L (ref 39–117)
BUN/Creatinine Ratio: 14 (ref 10–24)
BUN: 14 mg/dL (ref 8–27)
Bilirubin Total: 0.4 mg/dL (ref 0.0–1.2)
CO2: 22 mmol/L (ref 20–29)
Calcium: 9 mg/dL (ref 8.6–10.2)
Chloride: 103 mmol/L (ref 96–106)
Creatinine, Ser: 0.98 mg/dL (ref 0.76–1.27)
GFR calc Af Amer: 83 mL/min/{1.73_m2} (ref >59)
GFR calc non Af Amer: 72 mL/min/{1.73_m2} (ref >59)
Globulin, Total: 2.4 g/dL (ref 1.5–4.5)
Glucose: 105 mg/dL — ABNORMAL HIGH (ref 65–99)
Potassium: 4.5 mmol/L (ref 3.5–5.2)
Sodium: 139 mmol/L (ref 134–144)
Total Protein: 6.6 g/dL (ref 6.0–8.5)

## 2019-10-23 LAB — LIPID PANEL
Chol/HDL Ratio: 4.1 ratio (ref 0.0–5.0)
Cholesterol, Total: 164 mg/dL (ref 100–199)
HDL: 40 mg/dL (ref >39)
LDL Chol Calc (NIH): 110 mg/dL — ABNORMAL HIGH (ref 0–99)
Triglycerides: 75 mg/dL (ref 0–149)
VLDL Cholesterol Cal: 14 mg/dL (ref 5–40)

## 2019-10-23 LAB — PRO B NATRIURETIC PEPTIDE: NT-Pro BNP: 166 pg/mL (ref 0–486)

## 2019-11-03 ENCOUNTER — Other Ambulatory Visit: Payer: Self-pay | Admitting: *Deleted

## 2019-11-03 MED ORDER — PRAVASTATIN SODIUM 20 MG PO TABS: 20.0000 mg | ORAL_TABLET | Freq: Every evening | ORAL | 1 refills | Status: DC

## 2019-11-10 ENCOUNTER — Other Ambulatory Visit: Payer: Self-pay | Admitting: *Deleted

## 2020-01-31 ENCOUNTER — Other Ambulatory Visit: Payer: Self-pay | Admitting: Cardiology

## 2020-01-31 MED ORDER — PRAVASTATIN SODIUM 20 MG PO TABS: 20.0000 mg | ORAL_TABLET | Freq: Every evening | ORAL | 1 refills | Status: DC

## 2020-01-31 NOTE — Telephone Encounter (Signed)
*  STAT* If patient is at the pharmacy, call can be transferred to refill team.   1. Which medications need to be refilled? (please list name of each medication and dose if known)  pravastatin (PRAVACHOL) 20 MG tablet  2. Which pharmacy/location (including street and city if local pharmacy) is medication to be sent to? [facility]  3. Do they need a 30 day or 90 day supply? 90 day supply   Patient only has 6 tablets left

## 2020-01-31 NOTE — Telephone Encounter (Signed)
Refill sent in per patient request

## 2020-05-03 ENCOUNTER — Other Ambulatory Visit: Payer: Self-pay | Admitting: Cardiology

## 2020-05-15 DIAGNOSIS — C801 Malignant (primary) neoplasm, unspecified: Secondary | ICD-10-CM | POA: Insufficient documentation

## 2020-05-15 DIAGNOSIS — H269 Unspecified cataract: Secondary | ICD-10-CM | POA: Insufficient documentation

## 2020-05-15 DIAGNOSIS — E119 Type 2 diabetes mellitus without complications: Secondary | ICD-10-CM | POA: Insufficient documentation

## 2020-05-15 DIAGNOSIS — I219 Acute myocardial infarction, unspecified: Secondary | ICD-10-CM | POA: Insufficient documentation

## 2020-05-15 DIAGNOSIS — I2510 Atherosclerotic heart disease of native coronary artery without angina pectoris: Secondary | ICD-10-CM | POA: Insufficient documentation

## 2020-05-15 DIAGNOSIS — K219 Gastro-esophageal reflux disease without esophagitis: Secondary | ICD-10-CM | POA: Insufficient documentation

## 2020-05-15 DIAGNOSIS — I10 Essential (primary) hypertension: Secondary | ICD-10-CM | POA: Insufficient documentation

## 2020-05-15 DIAGNOSIS — M1990 Unspecified osteoarthritis, unspecified site: Secondary | ICD-10-CM | POA: Insufficient documentation

## 2020-05-15 DIAGNOSIS — I499 Cardiac arrhythmia, unspecified: Secondary | ICD-10-CM | POA: Insufficient documentation

## 2020-05-15 HISTORY — DX: Unspecified cataract: H26.9

## 2020-05-15 NOTE — Progress Notes (Signed)
Cardiology Office Note:    Date:  05/16/2020   ID:  Benjamin Andrade, DOB 09-11-1935, MRN [id number]  PCP:  Myrlene Broker, MD  Cardiologist:  Shirlee More, MD    Referring MD: Myrlene Broker, MD    ASSESSMENT:    1. Coronary artery disease involving native coronary artery of native heart with angina pectoris ([place])   2. Essential hypertension   3. Mixed hyperlipidemia    PLAN:    In order of problems listed above:  1. Stable CAD having no angina on current medical therapy we will continue his long-term dual antiplatelet therapy lipid-lowering with a statin and beta-blocker.  At this time I would not advise an ischemia evaluation 2. BP at target on multiple antihypertensive agents including diuretic centrally active clonidine and carvedilol continue same 3. Continue statin check lab work today for renal function potassium with diuretic liver function statin lipid profile for efficacy   Next appointment: 6 months   Medication Adjustments/Labs and Tests Ordered: Current medicines are reviewed at length with the patient today.  Concerns regarding medicines are outlined above.  Orders Placed This Encounter  Procedures  . Comprehensive metabolic panel  . Lipid panel   No orders of the defined types were placed in this encounter.   Chief Complaint  Patient presents with  . Follow-up  . Coronary Artery Disease  . Hypertension    History of Present Illness:     Benjamin Andrade is a 85 y.o. male with a hx of CAD S/P PCI in 2014 with DES to Benjamin Andrade in 2014 Dyslipidemia mild carotid atherosclerosis and  resistant HTN  last seen 10/22/2019.  EKG 10/22/2019 showed sinus bradycardia 53 bpm otherwise normal. Compliance with diet, lifestyle and medications: Yes  [place] is doing well he remains active has good healthcare literacy practices 3 WS has had Covid vaccine.  His biggest problem in life is gouty arthritis he takes Indocin.  He has had no angina dyspnea palpitation or edema.  Recent  labs reviewed with him from his primary care physician. Past Medical History:  Diagnosis Date  . Arthritis   . Cancer ([place])    skin-lip-face  . Carotid stenosis, bilateral 05/09/2016  . Cataract 05/15/2020   Formatting of this note might be different from the original. tramatic catarct OD  . Coronary artery disease   . Coronary artery disease involving native coronary artery of native heart with angina pectoris ([place]) 08/22/2015   Overview:  80% CX - DES oct 2014 80% CX - DES oct 2014  . Diabetes mellitus without complication (HCC)    prediabetic  . Dysrhythmia    occ  . Essential hypertension 08/22/2015  . GERD (gastroesophageal reflux disease)   . GERD without esophagitis 05/09/2017  . H/O heart artery stent 05/26/2017  . Hyperlipemia 08/22/2015  . Hypertension   . Myocardial infarction ([place])   . Screening for colon cancer 05/27/2019  . Type 2 diabetes mellitus with hyperglycemia, without long-term current use of insulin ([place]) 05/09/2017    Past Surgical History:  Procedure Laterality Date  . BACK SURGERY    . CAROTID ENDARTERECTOMY Left   . CATARACT EXTRACTION    . INGUINAL HERNIA REPAIR    . KNEE ARTHROSCOPY Right   . REVERSE SHOULDER ARTHROPLASTY Right 03/28/2016   Procedure: REVERSE SHOULDER ARTHROPLASTY;  Surgeon: Meredith Pel, MD;  Location: [place];  Service: Orthopedics;  Laterality: Right;  . SHOULDER ARTHROSCOPY W/ ROTATOR CUFF REPAIR Right   . SKIN BIOPSY  Current Medications: Current Meds  Medication Sig  . albuterol (PROVENTIL HFA;VENTOLIN HFA) 108 (90 Base) MCG/ACT inhaler Inhale 2 puffs into the lungs every 6 (six) hours as needed for wheezing.  Marland Kitchen aspirin EC 81 MG tablet Take 81 mg by mouth daily.  . carvedilol (COREG) 25 MG tablet Take 1/2 tablet (12.5mg ) in the morning and 1 tablet (25mg ) in the afternoon  . cloNIDine (CATAPRES) 0.1 MG tablet Take 1 tablet (0.1 mg total) by mouth at bedtime. Take 1 extra tablet in the morning if your systolic BP (top  number) is 160 or greater.  . clopidogrel (PLAVIX) 75 MG tablet Take 75 mg by mouth every morning.   . colchicine 0.6 MG tablet Take 0.6 mg by mouth daily as needed.   . furosemide (LASIX) 20 MG tablet Take 20 mg by mouth as directed. Takes 1 tablet my mouth daily and twice daily as needed for swelling  . indomethacin (INDOCIN) 50 MG capsule Take 1 capsule by mouth daily as needed.  Marland Kitchen ipratropium-albuterol (DUONEB) 0.5-2.5 (3) MG/3ML SOLN Inhale 3 mLs into the lungs every 6 (six) hours as needed.  . Multiple Vitamin (MULTI-VITAMINS) TABS Take 1 tablet by mouth every morning.   . pantoprazole (PROTONIX) 40 MG tablet Take 40 mg by mouth every morning.   Marland Kitchen telmisartan (MICARDIS) 40 MG tablet TAKE 1 TABLET BY MOUTH  DAILY     Allergies:   No known allergies   Social History   Socioeconomic History  . Marital status: Married    Spouse name: Not on file  . Number of children: Not on file  . Years of education: Not on file  . Highest education level: Not on file  Occupational History  . Not on file  Tobacco Use  . Smoking status: Former Smoker    Packs/day: 2.00    Years: 49.00    Pack years: 98.00    Types: Cigarettes    Quit date: 03/22/1988    Years since quitting: 32.1  . Smokeless tobacco: Never Used  Vaping Use  . Vaping Use: Never used  Substance and Sexual Activity  . Alcohol use: No  . Drug use: No  . Sexual activity: Not Currently  Other Topics Concern  . Not on file  Social History Narrative  . Not on file   Social Determinants of Health   Financial Resource Strain:   . Difficulty of Paying Living Expenses: Not on file  Food Insecurity:   . Worried About Charity fundraiser in the Last Year: Not on file  . Ran Out of Food in the Last Year: Not on file  Transportation Needs:   . Lack of Transportation (Medical): Not on file  . Lack of Transportation (Non-Medical): Not on file  Physical Activity:   . Days of Exercise per Week: Not on file  . Minutes of Exercise  per Session: Not on file  Stress:   . Feeling of Stress : Not on file  Social Connections:   . Frequency of Communication with Friends and Family: Not on file  . Frequency of Social Gatherings with Friends and Family: Not on file  . Attends Religious Services: Not on file  . Active Member of Clubs or Organizations: Not on file  . Attends Archivist Meetings: Not on file  . Marital Status: Not on file     Family History: The patient's family history includes Cancer in his father, mother, sister, and sister; Coronary artery disease in his sister. ROS:  Please see the history of present illness.    All other systems reviewed and are negative.  EKGs/Labs/Other Studies Reviewed:    The following studies were reviewed today:   Recent Labs: 10/22/2019: ALT 21; BUN 14; Creatinine, Ser 0.98; NT-Pro BNP 166; Potassium 4.5; Sodium 139  Recent Lipid Panel    Component Value Date/Time   CHOL 164 10/22/2019 0944   TRIG 75 10/22/2019 0944   HDL 40 10/22/2019 0944   CHOLHDL 4.1 10/22/2019 0944   LDLCALC 110 (H) 10/22/2019 0944    Physical Exam:    VS:  BP 136/78 (BP Location: Right Arm, Patient Position: Sitting, Cuff Size: Normal)   Pulse (!) 53   Ht 5\' 8"  (1.727 m)   Wt 222 lb 3.2 oz (100.8 kg)   SpO2 94%   BMI 33.79 kg/m     Wt Readings from Last 3 Encounters:  05/16/20 222 lb 3.2 oz (100.8 kg)  10/22/19 231 lb (104.8 kg)  03/23/19 226 lb (102.5 kg)     GEN:  Well nourished, well developed in no acute distress HEENT: Normal NECK: No JVD; No carotid bruits LYMPHATICS: No lymphadenopathy CARDIAC: RRR, no murmurs, rubs, gallops RESPIRATORY:  Clear to auscultation without rales, wheezing or rhonchi  ABDOMEN: Soft, non-tender, non-distended MUSCULOSKELETAL:  No edema; No deformity  SKIN: Warm and dry NEUROLOGIC:  Alert and oriented x 3 PSYCHIATRIC:  Normal affect    Signed, Shirlee More, MD  05/16/2020 8:45 AM    [place]

## 2020-05-16 ENCOUNTER — Other Ambulatory Visit: Payer: Self-pay

## 2020-05-16 ENCOUNTER — Ambulatory Visit: Payer: Medicare Other | Admitting: Cardiology

## 2020-05-16 ENCOUNTER — Encounter: Payer: Self-pay | Admitting: Cardiology

## 2020-05-16 VITALS — BP 136/78 | HR 53 | Ht 68.0 in | Wt 222.2 lb

## 2020-05-16 DIAGNOSIS — E782 Mixed hyperlipidemia: Secondary | ICD-10-CM

## 2020-05-16 DIAGNOSIS — I25119 Atherosclerotic heart disease of native coronary artery with unspecified angina pectoris: Secondary | ICD-10-CM

## 2020-05-16 DIAGNOSIS — I10 Essential (primary) hypertension: Secondary | ICD-10-CM | POA: Diagnosis not present

## 2020-05-16 LAB — COMPREHENSIVE METABOLIC PANEL
ALT: 26 IU/L (ref 0–44)
AST: 30 IU/L (ref 0–40)
Albumin/Globulin Ratio: 1.8 (ref 1.2–2.2)
Albumin: 4.2 g/dL (ref 3.6–4.6)
Alkaline Phosphatase: 64 IU/L (ref 48–121)
BUN/Creatinine Ratio: 15 (ref 10–24)
BUN: 15 mg/dL (ref 8–27)
Bilirubin Total: 0.5 mg/dL (ref 0.0–1.2)
CO2: 22 mmol/L (ref 20–29)
Calcium: 9 mg/dL (ref 8.6–10.2)
Chloride: 103 mmol/L (ref 96–106)
Creatinine, Ser: 0.97 mg/dL (ref 0.76–1.27)
GFR calc Af Amer: 84 mL/min/{1.73_m2} (ref >59)
GFR calc non Af Amer: 72 mL/min/{1.73_m2} (ref >59)
Globulin, Total: 2.3 g/dL (ref 1.5–4.5)
Glucose: 110 mg/dL — ABNORMAL HIGH (ref 65–99)
Potassium: 4.3 mmol/L (ref 3.5–5.2)
Sodium: 138 mmol/L (ref 134–144)
Total Protein: 6.5 g/dL (ref 6.0–8.5)

## 2020-05-16 LAB — LIPID PANEL
Chol/HDL Ratio: 3.3 ratio (ref 0.0–5.0)
Cholesterol, Total: 118 mg/dL (ref 100–199)
HDL: 36 mg/dL — ABNORMAL LOW (ref >39)
LDL Chol Calc (NIH): 65 mg/dL (ref 0–99)
Triglycerides: 89 mg/dL (ref 0–149)
VLDL Cholesterol Cal: 17 mg/dL (ref 5–40)

## 2020-05-16 NOTE — Patient Instructions (Signed)

## 2020-05-17 ENCOUNTER — Telehealth: Payer: Self-pay

## 2020-05-17 NOTE — Telephone Encounter (Signed)
-----   Message from Richardo Priest, MD sent at 05/16/2020  5:17 PM EDT ----- Good result no changes in treatment

## 2020-05-17 NOTE — Telephone Encounter (Signed)
Tried calling patient. No answer and no voicemail set up for me to leave a message. 

## 2020-05-17 NOTE — Telephone Encounter (Signed)
Spoke with patient regarding results and recommendation.  Patient verbalizes understanding and is agreeable to plan of care. Advised patient to call back with any issues or concerns.  

## 2020-09-07 ENCOUNTER — Other Ambulatory Visit: Payer: Self-pay | Admitting: Cardiology

## 2020-11-05 NOTE — Progress Notes (Signed)
Cardiology Office Note:    Date:  11/06/2020   ID:  Benjamin Andrade, DOB 1935/03/10, MRN [id number]  PCP:  Myrlene Broker, MD  Cardiologist:  Shirlee More, MD    Referring MD: Myrlene Broker, MD    ASSESSMENT:    1. Coronary artery disease involving native coronary artery of native heart with angina pectoris ([place])   2. Essential hypertension   3. Mixed hyperlipidemia   4. Bilateral carpal tunnel syndrome   5. Bradycardia    PLAN:    In order of problems listed above:  1. Stable CAD continue treatment including his aspirin clopidogrel beta-blocker and low intensity statin. 2. Stable blood pressure is in range not can intensify treatment because of his symptomatic hypotension warnings continue his current regimen including diuretic furosemide as needed calcium channel blocker beta-blocker ARB and centrally active clonidine. 3. Stable lipids at target continue statin 4. Stable asymptomatic mild bradycardia   Next appointment: 1 year   Medication Adjustments/Labs and Tests Ordered: Current medicines are reviewed at length with the patient today.  Concerns regarding medicines are outlined above.  No orders of the defined types were placed in this encounter.  No orders of the defined types were placed in this encounter.   Chief Complaint  Patient presents with  . Follow-up  . Coronary Artery Disease    History of Present Illness:    Benjamin Andrade is a 86 y.o. male with a hx of CAD PCI drug-eluting stent to left circumflex in 2014 previously resistant hypertension dyslipidemia and mild carotid atherosclerosis last seen 04/28/2020 with stable CAD Compliance with diet, lifestyle and medications: Yes  Overall Benjamin Andrade continues to do well he is active he has had no angina dyspnea shortness of breath palpitation or syncope.  I reviewed his recent labs lipids are at target.  Home blood pressure generally runs in the range of 140-145/80.  In the mornings if he gets systolics less  than 191 if he takes carvedilol he gets lightheaded and he has to rest.  On those days he skips the morning dose.  He has no muscle pain or weakness from his statin.  His most recent labs [facility] PCP 08/16/2020 shows cholesterol 136 LDL at target 84 triglycerides 118 HDL 44 CMP normal potassium 4.6 GFR 80 cc  Past Medical History:  Diagnosis Date  . Arthritis   . Cancer ([place])    skin-lip-face  . Carotid stenosis, bilateral 05/09/2016  . Cataract 05/15/2020   Formatting of this note might be different from the original. tramatic catarct OD  . Coronary artery disease   . Coronary artery disease involving native coronary artery of native heart with angina pectoris ([place]) 08/22/2015   Overview:  80% CX - DES oct 2014 80% CX - DES oct 2014  . Diabetes mellitus without complication (HCC)    prediabetic  . Dysrhythmia    occ  . Essential hypertension 08/22/2015  . GERD (gastroesophageal reflux disease)   . GERD without esophagitis 05/09/2017  . H/O heart artery stent 05/26/2017  . Hyperlipemia 08/22/2015  . Hypertension   . Myocardial infarction ([place])   . Screening for colon cancer 05/27/2019  . Type 2 diabetes mellitus with hyperglycemia, without long-term current use of insulin ([place]) 05/09/2017    Past Surgical History:  Procedure Laterality Date  . BACK SURGERY    . CAROTID ENDARTERECTOMY Left   . CATARACT EXTRACTION    . INGUINAL HERNIA REPAIR    . KNEE ARTHROSCOPY Right   .  REVERSE SHOULDER ARTHROPLASTY Right 03/28/2016   Procedure: REVERSE SHOULDER ARTHROPLASTY;  Surgeon: Meredith Pel, MD;  Location: [place];  Service: Orthopedics;  Laterality: Right;  . SHOULDER ARTHROSCOPY W/ ROTATOR CUFF REPAIR Right   . SKIN BIOPSY      Current Medications: Current Meds  Medication Sig  . albuterol (PROVENTIL HFA;VENTOLIN HFA) 108 (90 Base) MCG/ACT inhaler Inhale 2 puffs into the lungs every 6 (six) hours as needed for wheezing.  Marland Kitchen amLODipine (NORVASC) 5 MG tablet Take by mouth.  Take 0.5 tablet ( 2.5 mg ) morning and 5 mg night  . aspirin EC 81 MG tablet Take 81 mg by mouth daily.  . carvedilol (COREG) 25 MG tablet Take 1/2 tablet (12.5mg ) in the morning and 1 tablet (25mg ) in the afternoon (Patient taking differently: Take 25 mg by mouth 2 (two) times daily with a meal.)  . clopidogrel (PLAVIX) 75 MG tablet Take 75 mg by mouth every morning.   . diclofenac (VOLTAREN) 75 MG EC tablet Take 756 mg by mouth 2 (two) times daily.  . furosemide (LASIX) 20 MG tablet Take 20 mg by mouth as directed. Takes 1 tablet my mouth daily as needed for swelling  . ipratropium-albuterol (DUONEB) 0.5-2.5 (3) MG/3ML SOLN Inhale 3 mLs into the lungs every 6 (six) hours as needed.  . Multiple Vitamin (MULTI-VITAMINS) TABS Take 1 tablet by mouth every morning.   . pantoprazole (PROTONIX) 40 MG tablet Take 40 mg by mouth every morning.   . pravastatin (PRAVACHOL) 20 MG tablet TAKE 1 TABLET BY MOUTH IN  THE EVENING  . telmisartan (MICARDIS) 40 MG tablet TAKE 1 TABLET BY MOUTH  DAILY     Allergies:   No known allergies   Social History   Socioeconomic History  . Marital status: Married    Spouse name: Not on file  . Number of children: Not on file  . Years of education: Not on file  . Highest education level: Not on file  Occupational History  . Not on file  Tobacco Use  . Smoking status: Former Smoker    Packs/day: 2.00    Years: 49.00    Pack years: 98.00    Types: Cigarettes    Quit date: 03/22/1988    Years since quitting: 32.6  . Smokeless tobacco: Never Used  Vaping Use  . Vaping Use: Never used  Substance and Sexual Activity  . Alcohol use: No  . Drug use: No  . Sexual activity: Not Currently  Other Topics Concern  . Not on file  Social History Narrative  . Not on file   Social Determinants of Health   Financial Resource Strain: Not on file  Food Insecurity: Not on file  Transportation Needs: Not on file  Physical Activity: Not on file  Stress: Not on file   Social Connections: Not on file     Family History: The patient's family history includes Cancer in his father, mother, sister, and sister; Coronary artery disease in his sister. ROS:   Please see the history of present illness.    All other systems reviewed and are negative.  EKGs/Labs/Other Studies Reviewed:    The following studies were reviewed today:  EKG:  EKG ordered today and personally reviewed.  The ekg ordered today demonstrates sinus rhythm 53 bpm is normal  Recent Labs: 05/16/2020: ALT 26; BUN 15; Creatinine, Ser 0.97; Potassium 4.3; Sodium 138  Recent Lipid Panel    Component Value Date/Time   CHOL 118 05/16/2020 0836  TRIG 89 05/16/2020 0836   HDL 36 (L) 05/16/2020 0836   CHOLHDL 3.3 05/16/2020 0836   LDLCALC 65 05/16/2020 0836    Physical Exam:    VS:  BP (!) 158/80   Pulse (!) 53   Ht 5\' 8"  (1.727 m)   Wt 232 lb 15.7 oz (105.7 kg)   SpO2 96%   BMI 35.42 kg/m     Wt Readings from Last 3 Encounters:  11/06/20 232 lb 15.7 oz (105.7 kg)  05/16/20 222 lb 3.2 oz (100.8 kg)  10/22/19 231 lb (104.8 kg)     GEN:  Well nourished, well developed in no acute distress HEENT: Normal NECK: No JVD; No carotid bruits LYMPHATICS: No lymphadenopathy CARDIAC: RRR, no murmurs, rubs, gallops RESPIRATORY:  Clear to auscultation without rales, wheezing or rhonchi  ABDOMEN: Soft, non-tender, non-distended MUSCULOSKELETAL:  No edema; No deformity  SKIN: Warm and dry NEUROLOGIC:  Alert and oriented x 3 PSYCHIATRIC:  Normal affect    Signed, Shirlee More, MD  11/06/2020 8:34 AM    [place]

## 2020-11-06 ENCOUNTER — Ambulatory Visit: Payer: Medicare Other | Admitting: Cardiology

## 2020-11-06 ENCOUNTER — Other Ambulatory Visit: Payer: Self-pay

## 2020-11-06 ENCOUNTER — Encounter: Payer: Self-pay | Admitting: Cardiology

## 2020-11-06 VITALS — BP 158/80 | HR 53 | Ht 68.0 in | Wt 233.0 lb

## 2020-11-06 DIAGNOSIS — E782 Mixed hyperlipidemia: Secondary | ICD-10-CM

## 2020-11-06 DIAGNOSIS — I25119 Atherosclerotic heart disease of native coronary artery with unspecified angina pectoris: Secondary | ICD-10-CM

## 2020-11-06 DIAGNOSIS — R001 Bradycardia, unspecified: Secondary | ICD-10-CM

## 2020-11-06 DIAGNOSIS — I10 Essential (primary) hypertension: Secondary | ICD-10-CM | POA: Diagnosis not present

## 2020-11-06 NOTE — Patient Instructions (Signed)

## 2021-03-07 DIAGNOSIS — R2242 Localized swelling, mass and lump, left lower limb: Secondary | ICD-10-CM | POA: Insufficient documentation

## 2021-03-07 DIAGNOSIS — L03116 Cellulitis of left lower limb: Secondary | ICD-10-CM

## 2021-03-07 DIAGNOSIS — R0789 Other chest pain: Secondary | ICD-10-CM | POA: Insufficient documentation

## 2021-03-07 HISTORY — DX: Localized swelling, mass and lump, left lower limb: R22.42

## 2021-03-07 HISTORY — DX: Cellulitis of left lower limb: L03.116

## 2021-03-07 HISTORY — DX: Other chest pain: R07.89

## 2021-04-06 ENCOUNTER — Other Ambulatory Visit: Payer: Self-pay | Admitting: Cardiology

## 2021-08-10 ENCOUNTER — Other Ambulatory Visit: Payer: Self-pay | Admitting: Cardiology

## 2021-12-08 NOTE — Progress Notes (Addendum)
?Cardiology Office Note:   ? ?Date:  12/17/2021  ? ?ID:  Benjamin Andrade, DOB [birth year], MRN [id number] ? ?PCP:  Benjamin Broker, MD  ?Cardiologist:  Benjamin More, MD   ? ?Referring MD: Benjamin Broker, MD  ?Addendum 01/11/2022 ?a monitor that was called on Sunday.  He has 3 pauses all occurred at 4:00 in the morning highly suggestive of sleep apnea.  I would like him to be set up for a sleep study performed at [facility]. ? ?ASSESSMENT:   ? ?1. Palpitations   ?2. Coronary artery disease involving native coronary artery of native heart with angina pectoris ([place])   ?3. Essential hypertension   ?4. Mixed hyperlipidemia   ? ?PLAN:   ? ?In order of problems listed above: ? ?His biggest concern is palpitation at risk for concern is having atrial fibrillation we will apply a 2-week ZIO monitor to capture events that are occurring frequently.  If atrial fibrillation I will bring him back to my office and we need to initiate anticoagulant therapy ?Stable CAD having no anginal discomfort continue medical treatment aspirin beta-blocker calcium channel blocker and statin ?I suspect the early morning blood pressure is not a good guide if treatment aspirin and wait till later in the day discussed good technique in the log to record blood pressure will drop it off in 2 weeks and make a decision if he needs intensification of his treatment continue his ARB diuretic beta-blocker calcium channel blocker ?Lipids at target continue statin ?Stable bradycardia continue his beta-blocker ? ? ?Next appointment: 1 year unless the need to bring him back after reviewing his monitor ? ? ?Medication Adjustments/Labs and Tests Ordered: ?Current medicines are reviewed at length with the patient today.  Concerns regarding medicines are outlined above.  ?No orders of the defined types were placed in this encounter. ? ?No orders of the defined types were placed in this encounter. ? ? ?Chief Complaint  ?Patient presents with  ? Coronary Artery Disease   ?Recently has had episodes of his heart racing and 1 evening it lasted for 30 minutes, this is his primary concern ? ?History of Present Illness:   ? ?Benjamin Andrade is a 87 y.o. male with a hx of CAD S/P PCI in 2014 with DES to [facility] in 2014 Dyslipidemia mild carotid atherosclerosis and  resistant HTN  last seen 05/16/2020. ? ?Compliance with diet, lifestyle and medications: Yes ? ?Since last seen he has had no angina edema shortness of breath or syncope.  Recently is having frequent episodes of rapid heartbeat he has not captured it with his blood pressure cuff from 1 evening it lasted 30 minutes before he found relief. ?Home blood pressure in the morning before meds runs in the range of 150-155/85-89 I have asked him to start checking his blood pressure midday record for 2 weeks and leave in my office. ? ?Recent labs PCP 11/20/2021: ?CMP normal except for glucose 106 potassium is 4.5 creatinine 0.93 ?Lipid profile with a cholesterol 102 non-HDL cholesterol 65 LDL 63 triglycerides 67 HDL 37 ? ?CT of the chest with and without contrast follow-up for pneumonia 02/23/2021 showed partial clearing of left lower lobe pneumonia with a few tiny lung nodules left adrenal gland myelo lipoma and coronary artery calcification. ?Past Medical History:  ?Diagnosis Date  ? Arthritis   ? Cancer [facility])   ? skin-lip-face  ? Carotid stenosis, bilateral 05/09/2016  ? Cataract 05/15/2020  ? Formatting of this note might be different from the original.  tramatic catarct OD  ? Coronary artery disease   ? Coronary artery disease involving native coronary artery of native heart with angina pectoris ([place]) 08/22/2015  ? Overview:  80% CX - DES oct 2014 80% CX - DES oct 2014  ? Diabetes mellitus without complication ([place])   ? prediabetic  ? Dysrhythmia   ? occ  ? Essential hypertension 08/22/2015  ? GERD (gastroesophageal reflux disease)   ? GERD without esophagitis 05/09/2017  ? H/O heart artery stent 05/26/2017  ? Hyperlipemia 08/22/2015  ? Hypertension    ? Myocardial infarction [facility])   ? Screening for colon cancer 05/27/2019  ? Type 2 diabetes mellitus with hyperglycemia, without long-term current use of insulin ([place]) 05/09/2017  ? ? ?Past Surgical History:  ?Procedure Laterality Date  ? BACK SURGERY    ? CAROTID ENDARTERECTOMY Left   ? CATARACT EXTRACTION    ? INGUINAL HERNIA REPAIR    ? KNEE ARTHROSCOPY Right   ? REVERSE SHOULDER ARTHROPLASTY Right 03/28/2016  ? Procedure: REVERSE SHOULDER ARTHROPLASTY;  Surgeon: Meredith Pel, MD;  Location: [place];  Service: Orthopedics;  Laterality: Right;  ? SHOULDER ARTHROSCOPY W/ ROTATOR CUFF REPAIR Right   ? SKIN BIOPSY    ? ? ?Current Medications: ?Current Meds  ?Medication Sig  ? albuterol (PROVENTIL HFA;VENTOLIN HFA) 108 (90 Base) MCG/ACT inhaler Inhale 2 puffs into the lungs every 6 (six) hours as needed for wheezing.  ? amLODipine (NORVASC) 5 MG tablet Take by mouth. Take 0.5 tablet ( 2.5 mg ) morning and 5 mg night  ? aspirin EC 81 MG tablet Take 81 mg by mouth daily.  ? carvedilol (COREG) 25 MG tablet Take 1/2 tablet (12.'5mg'$ ) in the morning and 1 tablet ('25mg'$ ) in the afternoon (Patient taking differently: Take 25 mg by mouth 2 (two) times daily with a meal.)  ? clopidogrel (PLAVIX) 75 MG tablet Take 75 mg by mouth every morning.   ? diclofenac (VOLTAREN) 75 MG EC tablet Take 756 mg by mouth 2 (two) times daily.  ? furosemide (LASIX) 20 MG tablet Take 20 mg by mouth as directed. Takes 1 tablet my mouth daily as needed for swelling  ? ipratropium-albuterol (DUONEB) 0.5-2.5 (3) MG/3ML SOLN Inhale 3 mLs into the lungs every 6 (six) hours as needed.  ? Multiple Vitamin (MULTI-VITAMINS) TABS Take 1 tablet by mouth every morning.   ? pantoprazole (PROTONIX) 40 MG tablet Take 40 mg by mouth every morning.   ? pravastatin (PRAVACHOL) 20 MG tablet TAKE 1 TABLET BY MOUTH IN  THE EVENING  ? telmisartan (MICARDIS) 40 MG tablet TAKE 1 TABLET BY MOUTH  DAILY  ?  ? ?Allergies:   No known allergies  ? ?Social History   ? ?Socioeconomic History  ? Marital status: Married  ?  Spouse name: Not on file  ? Number of children: Not on file  ? Years of education: Not on file  ? Highest education level: Not on file  ?Occupational History  ? Not on file  ?Tobacco Use  ? Smoking status: Former  ?  Packs/day: 2.00  ?  Years: 49.00  ?  Pack years: 98.00  ?  Types: Cigarettes  ?  Quit date: 03/22/1988  ?  Years since quitting: 33.7  ?  Passive exposure: Past  ? Smokeless tobacco: Never  ?Vaping Use  ? Vaping Use: Never used  ?Substance and Sexual Activity  ? Alcohol use: No  ? Drug use: No  ? Sexual activity: Not Currently  ?Other Topics  Concern  ? Not on file  ?Social History Narrative  ? Not on file  ? ?Social Determinants of Health  ? ?Financial Resource Strain: Not on file  ?Food Insecurity: Not on file  ?Transportation Needs: Not on file  ?Physical Activity: Not on file  ?Stress: Not on file  ?Social Connections: Not on file  ?  ? ?Family History: ?The patient's family history includes Cancer in his father, mother, sister, and sister; Coronary artery disease in his sister. ?ROS:   ?Please see the history of present illness.    ?All other systems reviewed and are negative. ? ?EKGs/Labs/Other Studies Reviewed:   ? ?The following studies were reviewed today: ? ?EKG:  EKG ordered today and personally reviewed.  The ekg ordered today demonstrates sinus bradycardia 50 bpm otherwise normal ? ?Recent Labs: ?See history ?Recent Lipid Panel ?   ?Component Value Date/Time  ? CHOL 118 05/16/2020 0836  ? TRIG 89 05/16/2020 0836  ? HDL 36 (L) 05/16/2020 0836  ? CHOLHDL 3.3 05/16/2020 0836  ? LDLCALC 65 05/16/2020 0836  ? ? ?Physical Exam:   ? ?VS:  BP (!) 144/80 (BP Location: Right Arm)   Pulse (!) 50   Ht '5\' 8"'$  (1.727 m)   Wt 220 lb (99.8 kg)   SpO2 97%   BMI 33.45 kg/m?    ? ?Wt Readings from Last 3 Encounters:  ?12/17/21 220 lb (99.8 kg)  ?11/06/20 232 lb 15.7 oz (105.7 kg)  ?05/16/20 222 lb 3.2 oz (100.8 kg)  ?  ? ?GEN: Obese BMI approaches 34  well nourished, well developed in no acute distress ?HEENT: Normal ?NECK: No JVD; No carotid bruits ?LYMPHATICS: No lymphadenopathy ?CARDIAC: RRR, no murmurs, rubs, gallops ?RESPIRATORY:  Clear to auscultation without

## 2021-12-17 ENCOUNTER — Ambulatory Visit (INDEPENDENT_AMBULATORY_CARE_PROVIDER_SITE_OTHER): Payer: Medicare Other

## 2021-12-17 ENCOUNTER — Ambulatory Visit (INDEPENDENT_AMBULATORY_CARE_PROVIDER_SITE_OTHER): Payer: Medicare Other | Admitting: Cardiology

## 2021-12-17 ENCOUNTER — Encounter: Payer: Self-pay | Admitting: Cardiology

## 2021-12-17 VITALS — BP 144/80 | HR 50 | Ht 68.0 in | Wt 220.0 lb

## 2021-12-17 DIAGNOSIS — E782 Mixed hyperlipidemia: Secondary | ICD-10-CM | POA: Diagnosis not present

## 2021-12-17 DIAGNOSIS — I25119 Atherosclerotic heart disease of native coronary artery with unspecified angina pectoris: Secondary | ICD-10-CM | POA: Diagnosis not present

## 2021-12-17 DIAGNOSIS — I10 Essential (primary) hypertension: Secondary | ICD-10-CM | POA: Diagnosis not present

## 2021-12-17 DIAGNOSIS — R002 Palpitations: Secondary | ICD-10-CM

## 2021-12-17 NOTE — Patient Instructions (Addendum)
[facility]  ?Tips to measure your blood pressure correctly ? ?To determine whether you have hypertension, a medical professional will take a blood pressure reading. How you prepare for the test, the position of your arm, and other factors can change a blood pressure reading by 10% or more. That could be enough to hide high blood pressure, start you on a drug you don't really need, or lead your doctor to incorrectly adjust your medications. ?National and international guidelines offer specific instructions for measuring blood pressure. If a doctor, nurse, or medical assistant isn't doing it right, don't hesitate to ask him or her to get with the guidelines. ?Here's what you can do to ensure a correct reading: ? Don't drink a caffeinated beverage or smoke during the 30 minutes before the test. ? Sit quietly for five minutes before the test begins. ? During the measurement, sit in a chair with your feet on the floor and your arm supported so your elbow is at about heart level. ? The inflatable part of the cuff should completely cover at least 80% of your upper arm, and the cuff should be placed on bare skin, not over a shirt. ? Don't talk during the measurement. ? Have your blood pressure measured twice, with a brief break in between. If the readings are different by 5 points or more, have it done a third time. ?There are times to break these rules. If you sometimes feel lightheaded when getting out of bed in the morning or when you stand after sitting, you should have your blood pressure checked while seated and then while standing to see if it falls from one position to the next. ?Because blood pressure varies throughout the day, your doctor will rarely diagnose hypertension on the basis of a single reading. Instead, he or she will want to confirm the measurements on at least two occasions, usually within a few weeks of one another. The exception to this rule is if you have a blood pressure reading of 180/110 mm Hg  or higher. A result this high usually calls for prompt treatment. ?It's also a good idea to have your blood pressure measured in both arms at least once, since the reading in one arm (usually the right) may be higher than that in the left. A 2014 study in The American Journal of Medicine of nearly 3,400 people found average arm- to-arm differences in systolic blood pressure of about 5 points. The higher number should be used to make treatment decisions. ?In 2017, new guidelines from the [place], the [employer] of Cardiology, and nine other health organizations lowered the diagnosis of high blood pressure to 130/80 mm Hg or higher for all adults. The guidelines also redefined the various blood pressure categories to now include normal, elevated, Stage 1 hypertension, Stage 2 hypertension, and hypertensive crisis (see "Blood pressure categories"). ?Blood pressure categories  ?Blood pressure category SYSTOLIC ?(upper number)  DIASTOLIC ?(lower number)  ?Normal Less than 120 mm Hg and Less than 80 mm Hg  ?Elevated 120-129 mm Hg and Less than 80 mm Hg  ?High blood pressure: Stage 1 hypertension 130-139 mm Hg or 80-89 mm Hg  ?High blood pressure: Stage 2 hypertension 140 mm Hg or higher or 90 mm Hg or higher  ?Hypertensive crisis (consult your doctor immediately) Higher than 180 mm Hg and/or Higher than 120 mm Hg  ?Source: American Heart Association and American Stroke Association. ?For more on getting your blood pressure under control, buy Controlling Your Blood  Pressure, a Special Health Report from [facility].  ? ?Medication Instructions:  ?Your physician recommends that you continue on your current medications as directed. Please refer to the Current Medication list given to you today. ? ?*If you need a refill on your cardiac medications before your next appointment, please call your pharmacy* ? ? ?Lab Work: ?None ordered ?If you have labs (blood work) drawn today and your tests  are completely normal, you will receive your results only by: ?MyChart Message (if you have MyChart) OR ?A paper copy in the mail ?If you have any lab test that is abnormal or we need to change your treatment, we will call you to review the results. ? ? ?Testing/Procedures: ? ?WHY IS MY DOCTOR PRESCRIBING ZIO? ?The Zio system is proven and trusted by physicians to detect and diagnose irregular heart rhythms -- and has been prescribed to hundreds of thousands of patients. ? ?The FDA has cleared the Zio system to monitor for many different kinds of irregular heart rhythms. In a study, physicians were able to reach a diagnosis 90% of the time with the Zio system1. ? ?You can wear the Zio monitor -- a small, discreet, comfortable patch -- during your normal day-to-day activity, including while you sleep, shower, and exercise, while it records every single heartbeat for analysis. ? ?1Barrett, P., et al. Comparison of 24 Hour Holter Monitoring Versus 14 Day Novel Adhesive Patch Electrocardiographic Monitoring. [place], 2014. ? ?ZIO VS. HOLTER MONITORING ?The Zio monitor can be comfortably worn for up to 14 days. Holter monitors can be worn for 24 to 48 hours, limiting the time to record any irregular heart rhythms you may have. Zio is able to capture data for the 51% of patients who have their first symptom-triggered arrhythmia after 48 hours.1 ? ?LIVE WITHOUT RESTRICTIONS ?The Zio ambulatory cardiac monitor is a small, unobtrusive, and water-resistant patch--you might even forget you?re wearing it. The Zio monitor records and stores every beat of your heart, whether you're sleeping, working out, or showering. ?Wear the monitor for 14 days, remove 12/31/21. ? ? ?Follow-Up: ?At [facility], you and your health needs are our priority.  As part of our continuing mission to provide you with exceptional heart care, we have created designated Provider Care Teams.  These Care Teams include your primary  Cardiologist (physician) and Advanced Practice Providers (APPs -  Physician Assistants and Nurse Practitioners) who all work together to provide you with the care you need, when you need it. ? ?We recommend signing up for the patient portal called "MyChart".  Sign up information is provided on this After Visit Summary.  MyChart is used to connect with patients for Virtual Visits (Telemedicine).  Patients are able to view lab/test results, encounter notes, upcoming appointments, etc.  Non-urgent messages can be sent to your provider as well.   ?To learn more about what you can do with MyChart, go to [email or web address].   ? ?Your next appointment:   ?12 month(s) ? ?The format for your next appointment:   ?In Person ? ?Provider:   ?Shirlee More, MD ? ? ?Other Instructions ?NA  ?

## 2022-01-04 ENCOUNTER — Telehealth: Payer: Self-pay | Admitting: Cardiology

## 2022-01-04 NOTE — Telephone Encounter (Signed)
Zio called with abnormal monitor results. Please use reference # [id number] ?

## 2022-01-04 NOTE — Telephone Encounter (Signed)
Called zio  and spoke to [place] regarding a zio alert on [employer] Zio reported that the patient had back to back pauses totaling 10.8 seconds on 12/19/21 at 4:14 am. The pauses can be found on page 15 of strip 4. The zio long term monitor report was printed out and Dr. Agustin Cree was informed. He gave no new orders for this patient and asked to let Dr. Bettina Gavia know next week. ?

## 2022-01-17 ENCOUNTER — Other Ambulatory Visit: Payer: Self-pay

## 2022-02-22 ENCOUNTER — Other Ambulatory Visit: Payer: Self-pay

## 2022-02-28 ENCOUNTER — Other Ambulatory Visit: Payer: Self-pay

## 2022-03-01 ENCOUNTER — Other Ambulatory Visit: Payer: Self-pay | Admitting: Cardiology

## 2022-03-01 DIAGNOSIS — I499 Cardiac arrhythmia, unspecified: Secondary | ICD-10-CM

## 2022-03-01 DIAGNOSIS — I10 Essential (primary) hypertension: Secondary | ICD-10-CM

## 2022-03-01 DIAGNOSIS — I25119 Atherosclerotic heart disease of native coronary artery with unspecified angina pectoris: Secondary | ICD-10-CM

## 2022-03-08 ENCOUNTER — Other Ambulatory Visit: Payer: Self-pay | Admitting: Cardiology

## 2022-05-04 ENCOUNTER — Other Ambulatory Visit: Payer: Self-pay | Admitting: Cardiology

## 2022-05-16 ENCOUNTER — Telehealth: Payer: Self-pay | Admitting: *Deleted

## 2022-05-16 ENCOUNTER — Telehealth: Payer: Self-pay

## 2022-05-16 NOTE — Telephone Encounter (Signed)
Prior authorization started with CMM Key: [id number] for Pravastatin Sodium 20 mg

## 2022-05-16 NOTE — Telephone Encounter (Signed)
Patient was contacted by Terri Pennix to schedule HST and patient declined having the test. Order request cancelled.

## 2022-05-21 NOTE — Telephone Encounter (Signed)
Prior authorization not required per [facility] , copy of letter in chart media

## 2022-05-21 NOTE — Telephone Encounter (Signed)
Original amount of tablets for Pravastatin # 112 for 100 day request was sent in error on prior authorization.  Corrected clinical information with correct # of tablets faxed to [facility] ref # [id number] to [phone] Prior authorization advocate contact # [phone] option 1 for standard; option 2 for specialty

## 2022-11-28 HISTORY — DX: Morbid (severe) obesity due to excess calories: E66.01

## 2022-12-03 ENCOUNTER — Telehealth: Payer: Self-pay | Admitting: *Deleted

## 2022-12-03 NOTE — Telephone Encounter (Signed)
   Pre-operative Risk Assessment    Patient Name: Benjamin Andrade  DOB: 1934/11/03 MRN: [id number]      Request for Surgical Clearance    Procedure:   LEFT TOTAL HIP ARTHROPLASTY  Date of Surgery:  Clearance TBD                                 Surgeon:  DR. DANIEL MARCHWIANY Surgeon's Group or Practice Name:  [facility] Phone number:  [phone] ATTN: [facility] Fax number:  [phone]   Type of Clearance Requested:   - Medical  - Pharmacy:  Hold Aspirin and Clopidogrel (Plavix)     Type of Anesthesia:  Spinal   Additional requests/questions:    [name]   12/03/2022, 5:29 PM

## 2022-12-04 NOTE — Telephone Encounter (Signed)
   Name: Benjamin Andrade  DOB: May 12, 1935  MRN: [id number]  Primary Cardiologist: Shirlee More, MD  Chart reviewed as part of pre-operative protocol coverage. Because of [name]'s past medical history and time since last visit, he will require a follow-up in-office visit in order to better assess preoperative cardiovascular risk.  Pre-op covering staff: - Please schedule appointment and call patient to inform them. If patient already had an upcoming appointment within acceptable timeframe, please add "pre-op clearance" to the appointment notes so provider is aware. - Please contact requesting surgeon's office via preferred method (i.e, phone, fax) to inform them of need for appointment prior to surgery.   Mayra Reel, NP  12/04/2022, 8:58 AM

## 2022-12-04 NOTE — Telephone Encounter (Signed)
Dr. Bettina Gavia,  Benjamin Andrade saw this patient on 12/17/2021. He wore a Zio which showed pauses in the early morning hours. You requested he undergo a sleep study, which does not appear to be performed. He is now pending total hip replacement. Does he need to undergo sleep study prior to surgery?   Please route your response to [facility] Preop. I will communicate with requesting office once you have given recommendations.   Thank you!  Mayra Reel, NP

## 2022-12-05 NOTE — Telephone Encounter (Signed)
Pt has been scheduled to see Dwaine Gale, NP, 01/13/23, clearance will be addressed at that time.  Will route to requesting surgeon's office to make them aware.

## 2023-01-11 NOTE — Progress Notes (Addendum)
Cardiology Office Note:    Date:  01/13/2023   ID:  Benjamin Andrade, DOB Nov 09, 1934, MRN [id number]  PCP:  Hadley Pen, MD   [facility] Providers Cardiologist:  Norman Herrlich, MD     Referring MD: Hadley Pen, MD   CC: follow up for CAD  History of Present Illness:    Benjamin Andrade is a 88 y.o. male with a hx of carotid artery stenosis, CAD PCI in 2014 with DES to [facility] , hypertension, GERD, DM 2, hyperlipidemia, bradycardia.  Stress echo in 2017 was normal.  Carotid duplex in 2019 revealed mild bilateral stenosis.  Most recently he was evaluated by Dr. Dulce Sellar on 12/17/2021 with concerns of palpitations he wore a monitor, results outlined below.  It was suggested that he undergo sleep evaluation however it appears that that has not been completed yet.  Stable bradycardia, continue beta-blocker.  01/07/2022 long-term monitor which revealed minimum heart rate of 30, max 193 average 57 bpm, predominant underlying rhythm is sinus rhythm.  69 episodes of SVT occurred.  3 pauses occurred the longest lasting 3.8 seconds all of these occurred around 4 AM.  No episodes of A-fib, 4% SVE, SVE couplets were rare at less than 1% and SVE triplets were rare at less than 1%.  Request to hold aspirin and Plavix, left knee replacement TBD.  He presents today accompanied by his wife for follow-up of his CAD and for preoperative clearance for upcoming left knee replacement.  He says he has had episodes of shortness of breath with minimal exertion however he states that he feels it is related to his left knee pain.  He also states he has had worsening pedal edema over the last 2 to 3 months.  We discussed recommended follow-up for OSA assessment however he advised he is not willing to proceed with that.  He denies chest pain, palpitations, pnd, orthopnea, n, v, dizziness, syncope, weight gain, or early satiety.   Past Medical History:  Diagnosis Date   Arthritis    Cancer [facility])     skin-lip-face   Carotid stenosis, bilateral 05/09/2016   Cataract 05/15/2020   Formatting of this note might be different from the original. 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 (HCC) 08/22/2015   Overview:  80% CX - DES oct 2014 80% CX - DES oct 2014   Diabetes mellitus without complication (HCC)    prediabetic   Dysrhythmia    occ   Essential hypertension 08/22/2015   GERD (gastroesophageal reflux disease)    GERD without esophagitis 05/09/2017   H/O heart artery stent 05/26/2017   Hyperlipemia 08/22/2015   Hypertension    Myocardial infarction (HCC)    Screening for colon cancer 05/27/2019   Type 2 diabetes mellitus with hyperglycemia, without long-term current use of insulin (HCC) 05/09/2017    Past Surgical History:  Procedure Laterality Date   BACK SURGERY     CAROTID ENDARTERECTOMY Left    CATARACT EXTRACTION     INGUINAL HERNIA REPAIR     KNEE ARTHROSCOPY Right    REVERSE SHOULDER ARTHROPLASTY Right 03/28/2016   Procedure: REVERSE SHOULDER ARTHROPLASTY;  Surgeon: Cammy Copa, MD;  Location: [facility] OR;  Service: Orthopedics;  Laterality: Right;   SHOULDER ARTHROSCOPY W/ ROTATOR CUFF REPAIR Right    SKIN BIOPSY      Current Medications: Current Meds  Medication Sig   albuterol (PROVENTIL  HFA;VENTOLIN HFA) 108 (90 Base) MCG/ACT inhaler Inhale 2 puffs into the lungs every 6 (six) hours as needed for wheezing.   amLODipine (NORVASC) 5 MG tablet Take by mouth. Take 0.5 tablet ( 2.5 mg ) morning and 5 mg night   aspirin EC 81 MG tablet Take 81 mg by mouth daily.   carvedilol (COREG) 25 MG tablet Take 25 mg by mouth daily.   clopidogrel (PLAVIX) 75 MG tablet Take 75 mg by mouth every morning.    diclofenac (VOLTAREN) 75 MG EC tablet Take 756 mg by mouth 2 (two) times daily.   ipratropium-albuterol (DUONEB) 0.5-2.5 (3) MG/3ML SOLN Inhale 3 mLs into the lungs every 6 (six)  hours as needed.   Multiple Vitamin (MULTI-VITAMINS) TABS Take 1 tablet by mouth every morning.    pantoprazole (PROTONIX) 40 MG tablet Take 40 mg by mouth every morning.    pravastatin (PRAVACHOL) 20 MG tablet TAKE 1 TABLET BY MOUTH IN THE  EVENING   telmisartan (MICARDIS) 40 MG tablet TAKE 1 TABLET BY MOUTH  DAILY   [DISCONTINUED] furosemide (LASIX) 20 MG tablet Take 20 mg by mouth as directed. Takes 1 tablet my mouth daily as needed for swelling     Allergies:   No known allergies   Social History   Socioeconomic History   Marital status: Married    Spouse name: Not on file   Number of children: Not on file   Years of education: Not on file   Highest education level: Not on file  Occupational History   Not on file  Tobacco Use   Smoking status: Former    Packs/day: 2.00    Years: 49.00    Additional pack years: 0.00    Total pack years: 98.00    Types: Cigarettes    Quit date: 03/22/1988    Years since quitting: 34.8    Passive exposure: Past   Smokeless tobacco: Never  Vaping Use   Vaping Use: Never used  Substance and Sexual Activity   Alcohol use: No   Drug use: No   Sexual activity: Not Currently  Other Topics Concern   Not on file  Social History Narrative   Not on file   Social Determinants of Health   Financial Resource Strain: Not on file  Food Insecurity: Not on file  Transportation Needs: Not on file  Physical Activity: Not on file  Stress: Not on file  Social Connections: Not on file     Family History: The patient's family history includes Cancer in his father, mother, sister, and sister; Coronary artery disease in his sister.  ROS:   Please see the history of present illness.     All other systems reviewed and are negative.  EKGs/Labs/Other Studies Reviewed:    The following studies were reviewed today: Cardiac Studies & Procedures         MONITORS  LONG TERM MONITOR (3-14 DAYS) 01/08/2022  Narrative Patch Wear Time:  13 days and 17  hours (2023-04-03T14:38:28-0400 to 2023-04-17T07:50:50-0400)  Patient had a min HR of 30 bpm, max HR of 193 bpm, and avg HR of 57 bpm. Predominant underlying rhythm was Sinus Rhythm.  69 Supraventricular Tachycardia runs occurred, the run with the fastest interval lasting 6 beats with a max rate of 193 bpm, the longest lasting 9 mins 33 secs with an avg rate of 110 bpm.  3 Pauses occurred, the longest lasting 3.8 secs (17 bpm).  All episodes occurred around 4 AM and precipitated by an  APC.  There were no episodes of atrial fibrillation or flutter.. Isolated SVEs were occasional (4.0%, 44025), SVE Couplets were rare (<1.0%, 1963), and SVE Triplets were rare (<1.0%, 171).  Did not make  Isolated VEs were rare (<1.0%, 4495), VE Couplets were rare (<1.0%, 797), and VE Triplets were rare (<1.0%, 132). Ventricular Trigeminy was present.            EKG:  EKG is  ordered today.  The ekg ordered today demonstrates SB, HR 50 bpm, consistent with prior EKG tracing.   Recent Labs: No results found for requested labs within last 365 days.  Recent Lipid Panel    Component Value Date/Time   CHOL 118 05/16/2020 0836   TRIG 89 05/16/2020 0836   HDL 36 (L) 05/16/2020 0836   CHOLHDL 3.3 05/16/2020 0836   LDLCALC 65 05/16/2020 0836     Risk Assessment/Calculations:           Physical Exam:    VS:  BP (!) 140/90 (BP Location: Left Arm, Patient Position: Sitting, Cuff Size: Normal)   Pulse (!) 50   Ht 5\' 8"  (1.727 m)   Wt 228 lb (103.4 kg)   SpO2 96%   BMI 34.67 kg/m     Wt Readings from Last 3 Encounters:  01/13/23 228 lb (103.4 kg)  12/17/21 220 lb (99.8 kg)  11/06/20 232 lb 15.7 oz (105.7 kg)     GEN:  Well nourished, well developed in no acute distress HEENT: Normal NECK: No JVD; No carotid bruits LYMPHATICS: No lymphadenopathy CARDIAC: RRR, no murmurs, rubs, gallops RESPIRATORY:  trace bilateral rales, no wheezing or rhonchi  ABDOMEN: Soft, non-tender,  non-distended MUSCULOSKELETAL:  +2 pitting edema; No deformity  SKIN: Warm and dry NEUROLOGIC:  Alert and oriented x 3 PSYCHIATRIC:  Normal affect   ASSESSMENT:    1. Coronary artery disease involving native coronary artery of native heart with angina pectoris (HCC)   2. Preoperative examination   3. Palpitations   4. Mixed hyperlipidemia   5. Localized swelling of both lower legs   6. Essential hypertension   7. Bradycardia    PLAN:    In order of problems listed above:  CAD - PCI in 2014 with DES to [facility], Stable with no anginal symptoms. No indication for ischemic evaluation. He endorses some new DOE over the last few months, likely related to deconditioning from knee pain and/or volume overload per below. Will arrange for myoview.  Continue aspirin 81 mg daily, continue Coreg 25 mg twice daily, continue Plavix 75 mg daily, continue Pravachol 20 mg daily Pedal edema - states this is somewhat new for him over the last two months, trace rales appreciated as well. Will repeat echo, pro-BNP.  He has Lasix that he can take as needed however he is unable to tell me when he is supposed to take it and has not taken it for several weeks.  For the next 3 days he will take Lasix 20 mg p.o. twice daily. Discussed daily weights and if he does not notice an improvement with his weight loss ~ 5 days, he will call the office. Recent creatinine 0.97, potassium 4.3. Will repeat BMET today.  Palpitations - quiescent. Continue carvedilol. Hypertension-blood pressure slightly elevated at 140/90 however he is volume overloaded today, continue telmisartan 40 mg daily, Coreg 25 mg twice daily, Norvasc 2.5 mg twice daily. Bradycardia-asymptomatic, heart rate today 50 bpm, continue Coreg. Suspected to be related to OSA and evaluation for this was recommended, however  he is resolute that he will not proceed with this.  Preoperative evaluation - According to the Revised Cardiac Risk Index (RCRI), his Perioperative  Risk of Major Cardiac Event is (%): 0.9 His Functional Capacity in METs is: 4.4 according to the [facility] Activity Status Index (DASI). He will need above testing completed prior to recommendations for proceeding with surgery. Will route this to scheduling surgeon so they are aware.    **addendum 01/31/23 - repeat echocardiogram showed HFpEF, he was started on Jardiance. Stress test was without ischemia, therefore he can proceed with surgery at an acceptable risk. Regarding his aspirin therapy, we prefer he continue throughout the perioperative stay, however if it is deemed too high of a risk by the surgeon, he can hold his ASA for 7 days and resume when directed to by the surgeon. Regarding his Plavix, he may hold this for 5 days, resume when directed to by surgeon, preferably as soon as possible.        Shared Decision Making/Informed Consent The risks [chest pain, shortness of breath, cardiac arrhythmias, dizziness, blood pressure fluctuations, myocardial infarction, stroke/transient ischemic attack, nausea, vomiting, allergic reaction, radiation exposure, metallic taste sensation and life-threatening complications (estimated to be 1 in 10,000)], benefits (risk stratification, diagnosing coronary artery disease, treatment guidance) and alternatives of a nuclear stress test were discussed in detail with Benjamin Andrade and he agrees to proceed.    Medication Adjustments/Labs and Tests Ordered: Current medicines are reviewed at length with the patient today.  Concerns regarding medicines are outlined above.  Orders Placed This Encounter  Procedures   Basic metabolic panel   CBC   Pro b natriuretic peptide (BNP)   MYOCARDIAL PERFUSION IMAGING   EKG 12-Lead   ECHOCARDIOGRAM COMPLETE   Meds ordered this encounter  Medications   furosemide (LASIX) 20 MG tablet    Sig: Takes 20 mg (1 tablets) my mouth twice daily for 3 days then 20 mg (1 tablet) daily as needed for swelling.    Dispense:  90 tablet     Refill:  1    Patient Instructions  Medication Instructions:  Your physician has recommended you make the following change in your medication:   Take 20 mg Lasix twice daily for 3 days, then decrease to as needed for weight gain.   *If you need a refill on your cardiac medications before your next appointment, please call your pharmacy*   Lab Work: Your physician recommends that you have labs done in the office today. Your test included  basic metabolic panel, complete blood count and ProBNP.  If you have labs (blood work) drawn today and your tests are completely normal, you will receive your results only by: MyChart Message (if you have MyChart) OR A paper copy in the mail If you have any lab test that is abnormal or we need to change your treatment, we will call you to review the results.   Testing/Procedures: You are scheduled for a Myocardial Perfusion Imaging Study.  Please arrive 15 minutes prior to your appointment time for registration and insurance purposes.  The test will take approximately 3 to 4 hours to complete; you may bring reading material.  If someone comes with you to your appointment, they will need to remain in the main lobby due to limited space in the testing area.   How to prepare for your Myocardial Perfusion Test: Do not eat or drink 3 hours prior to your test, except you may have water. Do not consume products containing  caffeine (regular or decaffeinated) 12 hours prior to your test. (ex: coffee, chocolate, sodas, tea). Do bring a list of your current medications with you.  If not listed below, you may take your medications as normal. Do wear comfortable clothes (no dresses or overalls) and walking shoes, tennis shoes preferred (No heels or open toe shoes are allowed). Do NOT wear cologne, perfume, aftershave, or lotions (deodorant is allowed). If these instructions are not followed, your test will have to be rescheduled.  If you cannot keep your  appointment, please provide 24 hours notification to the Nuclear Lab, to avoid a possible $50 charge to your account.   Your physician has requested that you have an echocardiogram. Echocardiography is a painless test that uses sound waves to create images of your heart. It provides your doctor with information about the size and shape of your heart and how well your heart's chambers and valves are working. This procedure takes approximately one hour. There are no restrictions for this procedure. Please do NOT wear cologne, perfume, aftershave, or lotions (deodorant is allowed). Please arrive 15 minutes prior to your appointment time.   Follow-Up: At [facility], you and your health needs are our priority.  As part of our continuing mission to provide you with exceptional heart care, we have created designated Provider Care Teams.  These Care Teams include your primary Cardiologist (physician) and Advanced Practice Providers (APPs -  Physician Assistants and Nurse Practitioners) who all work together to provide you with the care you need, when you need it.  We recommend signing up for the patient portal called "MyChart".  Sign up information is provided on this After Visit Summary.  MyChart is used to connect with patients for Virtual Visits (Telemedicine).  Patients are able to view lab/test results, encounter notes, upcoming appointments, etc.  Non-urgent messages can be sent to your provider as well.   To learn more about what you can do with MyChart, go to [email or web address].    Your next appointment:   1 month(s)  Provider:   Wallis Bamberg, NP Rosalita Levan)   Other Instructions  Cardiac Nuclear Scan A cardiac nuclear scan is a test that is done to check the flow of blood to your heart. It is done when you are resting and when you are exercising. The test looks for problems such as: Not enough blood reaching a portion of the heart. The heart muscle not working as it  should. You may need this test if you have: Heart disease. Lab results that are not normal. Had heart surgery or a balloon procedure to open up blocked arteries (angioplasty) or a small mesh tube (stent). Chest pain. Shortness of breath. Had a heart attack. In this test, a special dye (tracer) is put into your bloodstream. The tracer will travel to your heart. A camera will then take pictures of your heart to see how the tracer moves through your heart. This test is usually done at a hospital and takes 2-4 hours. Tell a doctor about: Any allergies you have. All medicines you are taking, including vitamins, herbs, eye drops, creams, and over-the-counter medicines. Any bleeding problems you have. Any surgeries you have had. Any medical conditions you have. Whether you are pregnant or may be pregnant. Any history of asthma or long-term (chronic) lung disease. Any history of heart rhythm disorders or heart valve conditions. What are the risks? Your doctor will talk with you about risks. These may include: Serious chest pain and heart attack. This  is only a risk if the stress portion of the test is done. Fast or uneven heartbeats (palpitations). A feeling of warmth in your chest. This feeling usually does not last long. Allergic reaction to the tracer. Shortness of breath or trouble breathing. What happens before the test? Ask your doctor about changing or stopping your normal medicines. Follow instructions from your doctor about what you cannot eat or drink. Remove your jewelry on the day of the test. Ask your doctor if you need to avoid nicotine or caffeine. What happens during the test? An IV tube will be inserted into one of your veins. Your doctor will give you a small amount of tracer through the IV tube. You will wait for 20-40 minutes while the tracer moves through your bloodstream. Your heart will be monitored with an electrocardiogram (ECG). You will lie down on an exam  table. Pictures of your heart will be taken for about 15-20 minutes. You may also have a stress test. For this test, one of these things may be done: You will be asked to exercise on a treadmill or a stationary bike. You will be given medicines that will make your heart work harder. This is done if you are unable to exercise. When blood flow to your heart has peaked, a tracer will again be given through the IV tube. After 20-40 minutes, you will get back on the exam table. More pictures will be taken of your heart. Depending on the tracer that is used, more pictures may need to be taken 3-4 hours later. Your IV tube will be removed when the test is over. The test may vary among doctors and hospitals. What happens after the test? Ask your doctor: Whether you can return to your normal schedule, including diet, activities, travel, and medicines. Whether you should drink more fluids. This will help to remove the tracer from your body. Ask your doctor, or the department that is doing the test: When will my results be ready? How will I get my results? What are my treatment options? What other tests do I need? What are my next steps? This information is not intended to replace advice given to you by your health care provider. Make sure you discuss any questions you have with your health care provider. Document Revised: 01/29/2022 Document Reviewed: 01/29/2022 Elsevier Patient Education  2023 Elsevier Inc.  Echocardiogram An echocardiogram is a test that uses sound waves (ultrasound) to produce images of the heart. Images from an echocardiogram can provide important information about: Heart size and shape. The size and thickness and movement of your heart's walls. Heart muscle function and strength. Heart valve function or if you have stenosis. Stenosis is when the heart valves are too narrow. If blood is flowing backward through the heart valves (regurgitation). A tumor or infectious growth  around the heart valves. Areas of heart muscle that are not working well because of poor blood flow or injury from a heart attack. Aneurysm detection. An aneurysm is a weak or damaged part of an artery wall. The wall bulges out from the normal force of blood pumping through the body. Tell a health care provider about: Any allergies you have. All medicines you are taking, including vitamins, herbs, eye drops, creams, and over-the-counter medicines. Any blood disorders you have. Any surgeries you have had. Any medical conditions you have. Whether you are pregnant or may be pregnant. What are the risks? Generally, this is a safe test. However, problems may occur, including an allergic  reaction to dye (contrast) that may be used during the test. What happens before the test? No specific preparation is needed. You may eat and drink normally. What happens during the test?  You will take off your clothes from the waist up and put on a hospital gown. Electrodes or electrocardiogram (ECG)patches may be placed on your chest. The electrodes or patches are then connected to a device that monitors your heart rate and rhythm. You will lie down on a table for an ultrasound exam. A gel will be applied to your chest to help sound waves pass through your skin. A handheld device, called a transducer, will be pressed against your chest and moved over your heart. The transducer produces sound waves that travel to your heart and bounce back (or "echo" back) to the transducer. These sound waves will be captured in real-time and changed into images of your heart that can be viewed on a video monitor. The images will be recorded on a computer and reviewed by your health care provider. You may be asked to change positions or hold your breath for a short time. This makes it easier to get different views or better views of your heart. In some cases, you may receive contrast through an IV in one of your veins. This can  improve the quality of the pictures from your heart. The procedure may vary among health care providers and hospitals. What can I expect after the test? You may return to your normal, everyday life, including diet, activities, and medicines, unless your health care provider tells you not to do that. Follow these instructions at home: It is up to you to get the results of your test. Ask your health care provider, or the department that is doing the test, when your results will be ready. Keep all follow-up visits. This is important. Summary An echocardiogram is a test that uses sound waves (ultrasound) to produce images of the heart. Images from an echocardiogram can provide important information about the size and shape of your heart, heart muscle function, heart valve function, and other possible heart problems. You do not need to do anything to prepare before this test. You may eat and drink normally. After the echocardiogram is completed, you may return to your normal, everyday life, unless your health care provider tells you not to do that. This information is not intended to replace advice given to you by your health care provider. Make sure you discuss any questions you have with your health care provider. Document Revised: 05/16/2021 Document Reviewed: 04/25/2020 Elsevier Patient Education  2023 Elsevier Inc.   Heart Failure Action Plan A heart failure action plan helps you understand what to do when you have symptoms of heart failure. Your action plan is a color-coded plan that lists the symptoms to watch for and indicates what actions to take. If you have symptoms in the red zone, you need medical care right away. If you have symptoms in the yellow zone, you are having problems. If you have symptoms in the green zone, you are doing well. Follow the plan that was created by you and your health care provider. Review your plan each time you visit your health care provider. Red  zone These signs and symptoms mean you should get medical help right away: You have trouble breathing when resting. You have a dry cough that is getting worse. You have swelling or pain in your legs or abdomen that is getting worse. You suddenly gain more than  2-3 lb (0.9-1.4 kg) in 24 hours, or more than 5 lb (2.3 kg) in a week. This amount may be more or less depending on your condition. You have trouble staying awake or you feel confused. You have chest pain. You do not have an appetite. You pass out. You have worsening sadness or depression. If you have any of these symptoms, call your local emergency services (911 in the [place] right away. Do not drive yourself to the hospital. Yellow zone These signs and symptoms mean your condition may be getting worse and you should make some changes: You have trouble breathing when you are active, or you need to sleep with your head raised on extra pillows to help you breathe. You have swelling in your legs or abdomen. You gain 2-3 lb (0.9-1.4 kg) in 24 hours, or 5 lb (2.3 kg) in a week. This amount may be more or less depending on your condition. You get tired easily. You have trouble sleeping. You have a dry cough. If you have any of these symptoms: Contact your health care provider within the next day. Your health care provider may adjust your medicines. Green zone These signs mean you are doing well and can continue what you are doing: You do not have shortness of breath. You have very little swelling or no new swelling. Your weight is stable (no gain or loss). You have a normal activity level. You do not have chest pain or any other new symptoms. Follow these instructions at home: Take over-the-counter and prescription medicines only as told by your health care provider. Weigh yourself daily. Eat a heart-healthy diet. Work with a diet and nutrition specialist (dietitian) to create an eating plan that is best for you. Keep all follow-up  visits. This is important. Where to find more information American Heart Association: Summary A heart failure action plan helps you understand what to do when you have symptoms of heart failure. Follow the action plan that was created by you and your health care provider. Get help right away if you have any symptoms in the red zone. This information is not intended to replace advice given to you by your health care provider. Make sure you discuss any questions you have with your health care provider. Document Revised: 12/11/2021 Document Reviewed: 04/17/2020 Elsevier Patient Education  [address]    Signed, Flossie Dibble, NP  01/13/2023 1:00 PM    [facility]

## 2023-01-13 ENCOUNTER — Encounter: Payer: Self-pay | Admitting: Cardiology

## 2023-01-13 ENCOUNTER — Ambulatory Visit: Payer: Medicare Other | Attending: Cardiology | Admitting: Cardiology

## 2023-01-13 VITALS — BP 140/90 | HR 50 | Ht 68.0 in | Wt 228.0 lb

## 2023-01-13 DIAGNOSIS — E782 Mixed hyperlipidemia: Secondary | ICD-10-CM | POA: Diagnosis not present

## 2023-01-13 DIAGNOSIS — R002 Palpitations: Secondary | ICD-10-CM

## 2023-01-13 DIAGNOSIS — Z01818 Encounter for other preprocedural examination: Secondary | ICD-10-CM | POA: Diagnosis not present

## 2023-01-13 DIAGNOSIS — I25119 Atherosclerotic heart disease of native coronary artery with unspecified angina pectoris: Secondary | ICD-10-CM

## 2023-01-13 DIAGNOSIS — R001 Bradycardia, unspecified: Secondary | ICD-10-CM

## 2023-01-13 DIAGNOSIS — I10 Essential (primary) hypertension: Secondary | ICD-10-CM

## 2023-01-13 DIAGNOSIS — R2243 Localized swelling, mass and lump, lower limb, bilateral: Secondary | ICD-10-CM

## 2023-01-13 MED ORDER — FUROSEMIDE 20 MG PO TABS: ORAL_TABLET | ORAL | 1 refills | Status: DC

## 2023-01-13 NOTE — Patient Instructions (Signed)
Medication Instructions:  Your physician has recommended you make the following change in your medication:   Take 20 mg Lasix twice daily for 3 days, then decrease to as needed for weight gain.   *If you need a refill on your cardiac medications before your next appointment, please call your pharmacy*   Lab Work: Your physician recommends that you have labs done in the office today. Your test included  basic metabolic panel, complete blood count and ProBNP.  If you have labs (blood work) drawn today and your tests are completely normal, you will receive your results only by: MyChart Message (if you have MyChart) OR A paper copy in the mail If you have any lab test that is abnormal or we need to change your treatment, we will call you to review the results.   Testing/Procedures: You are scheduled for a Myocardial Perfusion Imaging Study.  Please arrive 15 minutes prior to your appointment time for registration and insurance purposes.  The test will take approximately 3 to 4 hours to complete; you may bring reading material.  If someone comes with you to your appointment, they will need to remain in the main lobby due to limited space in the testing area.   How to prepare for your Myocardial Perfusion Test: Do not eat or drink 3 hours prior to your test, except you may have water. Do not consume products containing caffeine (regular or decaffeinated) 12 hours prior to your test. (ex: coffee, chocolate, sodas, tea). Do bring a list of your current medications with you.  If not listed below, you may take your medications as normal. Do wear comfortable clothes (no dresses or overalls) and walking shoes, tennis shoes preferred (No heels or open toe shoes are allowed). Do NOT wear cologne, perfume, aftershave, or lotions (deodorant is allowed). If these instructions are not followed, your test will have to be rescheduled.  If you cannot keep your appointment, please provide 24 hours  notification to the [facility], to avoid a possible $50 charge to your account.   Your physician has requested that you have an echocardiogram. Echocardiography is a painless test that uses sound waves to create images of your heart. It provides your doctor with information about the size and shape of your heart and how well your heart's chambers and valves are working. This procedure takes approximately one hour. There are no restrictions for this procedure. Please do NOT wear cologne, perfume, aftershave, or lotions (deodorant is allowed). Please arrive 15 minutes prior to your appointment time.   Follow-Up: At [facility], you and your health needs are our priority.  As part of our continuing mission to provide you with exceptional heart care, we have created designated Provider Care Teams.  These Care Teams include your primary Cardiologist (physician) and Advanced Practice Providers (APPs -  Physician Assistants and Nurse Practitioners) who all work together to provide you with the care you need, when you need it.  We recommend signing up for the patient portal called "MyChart".  Sign up information is provided on this After Visit Summary.  MyChart is used to connect with patients for Virtual Visits (Telemedicine).  Patients are able to view lab/test results, encounter notes, upcoming appointments, etc.  Non-urgent messages can be sent to your provider as well.   To learn more about what you can do with MyChart, go to [email or web address].    Your next appointment:   1 month(s)  Provider:   Wallis Bamberg, NP Rosalita Levan)  Other Instructions  Cardiac Nuclear Scan A cardiac nuclear scan is a test that is done to check the flow of blood to your heart. It is done when you are resting and when you are exercising. The test looks for problems such as: Not enough blood reaching a portion of the heart. The heart muscle not working as it should. You may need this test if you  have: Heart disease. Lab results that are not normal. Had heart surgery or a balloon procedure to open up blocked arteries (angioplasty) or a small mesh tube (stent). Chest pain. Shortness of breath. Had a heart attack. In this test, a special dye (tracer) is put into your bloodstream. The tracer will travel to your heart. A camera will then take pictures of your heart to see how the tracer moves through your heart. This test is usually done at a hospital and takes 2-4 hours. Tell a doctor about: Any allergies you have. All medicines you are taking, including vitamins, herbs, eye drops, creams, and over-the-counter medicines. Any bleeding problems you have. Any surgeries you have had. Any medical conditions you have. Whether you are pregnant or may be pregnant. Any history of asthma or long-term (chronic) lung disease. Any history of heart rhythm disorders or heart valve conditions. What are the risks? Your doctor will talk with you about risks. These may include: Serious chest pain and heart attack. This is only a risk if the stress portion of the test is done. Fast or uneven heartbeats (palpitations). A feeling of warmth in your chest. This feeling usually does not last long. Allergic reaction to the tracer. Shortness of breath or trouble breathing. What happens before the test? Ask your doctor about changing or stopping your normal medicines. Follow instructions from your doctor about what you cannot eat or drink. Remove your jewelry on the day of the test. Ask your doctor if you need to avoid nicotine or caffeine. What happens during the test? An IV tube will be inserted into one of your veins. Your doctor will give you a small amount of tracer through the IV tube. You will wait for 20-40 minutes while the tracer moves through your bloodstream. Your heart will be monitored with an electrocardiogram (ECG). You will lie down on an exam table. Pictures of your heart will be taken  for about 15-20 minutes. You may also have a stress test. For this test, one of these things may be done: You will be asked to exercise on a treadmill or a stationary bike. You will be given medicines that will make your heart work harder. This is done if you are unable to exercise. When blood flow to your heart has peaked, a tracer will again be given through the IV tube. After 20-40 minutes, you will get back on the exam table. More pictures will be taken of your heart. Depending on the tracer that is used, more pictures may need to be taken 3-4 hours later. Your IV tube will be removed when the test is over. The test may vary among doctors and hospitals. What happens after the test? Ask your doctor: Whether you can return to your normal schedule, including diet, activities, travel, and medicines. Whether you should drink more fluids. This will help to remove the tracer from your body. Ask your doctor, or the department that is doing the test: When will my results be ready? How will I get my results? What are my treatment options? What other tests do I need? What  are my next steps? This information is not intended to replace advice given to you by your health care provider. Make sure you discuss any questions you have with your health care provider. Document Revised: 01/29/2022 Document Reviewed: 01/29/2022 Elsevier Patient Education  2023 Else[employer] Inc.  Echocardiogram An echocardiogram is a test that uses sound waves (ultrasound) to produce images of the heart. Images from an echocardiogram can provide important information about: Heart size and shape. The size and thickness and movement of your heart's walls. Heart muscle function and strength. Heart valve function or if you have stenosis. Stenosis is when the heart valves are too narrow. If blood is flowing backward through the heart valves (regurgitation). A tumor or infectious growth around the heart valves. Areas of heart  muscle that are not working well because of poor blood flow or injury from a heart attack. Aneurysm detection. An aneurysm is a weak or damaged part of an artery wall. The wall bulges out from the normal force of blood pumping through the body. Tell a health care provider about: Any allergies you have. All medicines you are taking, including vitamins, herbs, eye drops, creams, and over-the-counter medicines. Any blood disorders you have. Any surgeries you have had. Any medical conditions you have. Whether you are pregnant or may be pregnant. What are the risks? Generally, this is a safe test. However, problems may occur, including an allergic reaction to dye (contrast) that may be used during the test. What happens before the test? No specific preparation is needed. You may eat and drink normally. What happens during the test?  You will take off your clothes from the waist up and put on a hospital gown. Electrodes or electrocardiogram (ECG)patches may be placed on your chest. The electrodes or patches are then connected to a device that monitors your heart rate and rhythm. You will lie down on a table for an ultrasound exam. A gel will be applied to your chest to help sound waves pass through your skin. A handheld device, called a transducer, will be pressed against your chest and moved over your heart. The transducer produces sound waves that travel to your heart and bounce back (or "echo" back) to the transducer. These sound waves will be captured in real-time and changed into images of your heart that can be viewed on a video monitor. The images will be recorded on a computer and reviewed by your health care provider. You may be asked to change positions or hold your breath for a short time. This makes it easier to get different views or better views of your heart. In some cases, you may receive contrast through an IV in one of your veins. This can improve the quality of the pictures from your  heart. The procedure may vary among health care providers and hospitals. What can I expect after the test? You may return to your normal, everyday life, including diet, activities, and medicines, unless your health care provider tells you not to do that. Follow these instructions at home: It is up to you to get the results of your test. Ask your health care provider, or the department that is doing the test, when your results will be ready. Keep all follow-up visits. This is important. Summary An echocardiogram is a test that uses sound waves (ultrasound) to produce images of the heart. Images from an echocardiogram can provide important information about the size and shape of your heart, heart muscle function, heart valve function, and other possible  heart problems. You do not need to do anything to prepare before this test. You may eat and drink normally. After the echocardiogram is completed, you may return to your normal, everyday life, unless your health care provider tells you not to do that. This information is not intended to replace advice given to you by your health care provider. Make sure you discuss any questions you have with your health care provider. Document Revised: 05/16/2021 Document Reviewed: 04/25/2020 Elsevier Patient Education  2023 Elsevier Inc.   Heart Failure Action Plan A heart failure action plan helps you understand what to do when you have symptoms of heart failure. Your action plan is a color-coded plan that lists the symptoms to watch for and indicates what actions to take. If you have symptoms in the red zone, you need medical care right away. If you have symptoms in the yellow zone, you are having problems. If you have symptoms in the green zone, you are doing well. Follow the plan that was created by you and your health care provider. Review your plan each time you visit your health care provider. Red zone These signs and symptoms mean you should get  medical help right away: You have trouble breathing when resting. You have a dry cough that is getting worse. You have swelling or pain in your legs or abdomen that is getting worse. You suddenly gain more than 2-3 lb (0.9-1.4 kg) in 24 hours, or more than 5 lb (2.3 kg) in a week. This amount may be more or less depending on your condition. You have trouble staying awake or you feel confused. You have chest pain. You do not have an appetite. You pass out. You have worsening sadness or depression. If you have any of these symptoms, call your local emergency services (911 in the [place] right away. Do not drive yourself to the hospital. Yellow zone These signs and symptoms mean your condition may be getting worse and you should make some changes: You have trouble breathing when you are active, or you need to sleep with your head raised on extra pillows to help you breathe. You have swelling in your legs or abdomen. You gain 2-3 lb (0.9-1.4 kg) in 24 hours, or 5 lb (2.3 kg) in a week. This amount may be more or less depending on your condition. You get tired easily. You have trouble sleeping. You have a dry cough. If you have any of these symptoms: Contact your health care provider within the next day. Your health care provider may adjust your medicines. Green zone These signs mean you are doing well and can continue what you are doing: You do not have shortness of breath. You have very little swelling or no new swelling. Your weight is stable (no gain or loss). You have a normal activity level. You do not have chest pain or any other new symptoms. Follow these instructions at home: Take over-the-counter and prescription medicines only as told by your health care provider. Weigh yourself daily. Eat a heart-healthy diet. Work with a diet and nutrition specialist (dietitian) to create an eating plan that is best for you. Keep all follow-up visits. This is important. Where to find more  information American Heart Association: Summary A heart failure action plan helps you understand what to do when you have symptoms of heart failure. Follow the action plan that was created by you and your health care provider. Get help right away if you have any symptoms in the red zone.  This information is not intended to replace advice given to you by your health care provider. Make sure you discuss any questions you have with your health care provider. Document Revised: 12/11/2021 Document Reviewed: 04/17/2020 Elsevier Patient Education  2023 [employer].

## 2023-01-14 LAB — BASIC METABOLIC PANEL WITH GFR
BUN/Creatinine Ratio: 13 (ref 10–24)
BUN: 11 mg/dL (ref 8–27)
CO2: 21 mmol/L (ref 20–29)
Calcium: 9.4 mg/dL (ref 8.6–10.2)
Chloride: 102 mmol/L (ref 96–106)
Creatinine, Ser: 0.84 mg/dL (ref 0.76–1.27)
Glucose: 113 mg/dL — ABNORMAL HIGH (ref 70–99)
Potassium: 4.5 mmol/L (ref 3.5–5.2)
Sodium: 142 mmol/L (ref 134–144)
eGFR: 85 mL/min/1.73

## 2023-01-14 LAB — CBC
Hematocrit: 47.5 % (ref 37.5–51.0)
Hemoglobin: 15.7 g/dL (ref 13.0–17.7)
MCH: 32.5 pg (ref 26.6–33.0)
MCHC: 33.1 g/dL (ref 31.5–35.7)
MCV: 98 fL — ABNORMAL HIGH (ref 79–97)
Platelets: 228 x10E3/uL (ref 150–450)
RBC: 4.83 x10E6/uL (ref 4.14–5.80)
RDW: 12.2 % (ref 11.6–15.4)
WBC: 7.8 x10E3/uL (ref 3.4–10.8)

## 2023-01-14 LAB — PRO B NATRIURETIC PEPTIDE: NT-Pro BNP: 434 pg/mL (ref 0–486)

## 2023-01-17 ENCOUNTER — Telehealth: Payer: Self-pay | Admitting: Cardiology

## 2023-01-17 NOTE — Telephone Encounter (Signed)
Recommendations reviewed with pt as per Jennifer Woody PA's note.  Pt verbalized understanding and had no additional questions.   

## 2023-01-17 NOTE — Telephone Encounter (Signed)
Pt c/o swelling: STAT is pt has developed SOB within 24 hours  How much weight have you gained and in what time span? From 226 at last office visit and as of this morning pt is 220. Pt spouse stated she was told by Victorino Dike to call office if any changes after last visit. She stated swelling is still the same as well.  If swelling, where is the swelling located? legs  Are you currently taking a fluid pill? Yes  Are you currently SOB? No  Do you have a log of your daily weights (if so, list)? No  Have you gained 3 pounds in a day or 5 pounds in a week? No  Have you traveled recently? No

## 2023-01-21 ENCOUNTER — Telehealth (HOSPITAL_COMMUNITY): Payer: Self-pay | Admitting: *Deleted

## 2023-01-21 NOTE — Telephone Encounter (Signed)
Patient given detailed instructions per Myocardial Perfusion Study Information Sheet for the test on 01/28/23 Patient notified to arrive 15 minutes early and that it is imperative to arrive on time for appointment to keep from having the test rescheduled.  If you need to cancel or reschedule your appointment, please call the office within 24 hours of your appointment. . Patient verbalized understanding. Benjamin Andrade

## 2023-01-23 ENCOUNTER — Other Ambulatory Visit: Payer: Self-pay | Admitting: Cardiology

## 2023-01-24 NOTE — Telephone Encounter (Signed)
Refill to pharmacy 

## 2023-01-28 ENCOUNTER — Ambulatory Visit: Payer: Medicare Other | Attending: Cardiology

## 2023-01-28 ENCOUNTER — Ambulatory Visit (INDEPENDENT_AMBULATORY_CARE_PROVIDER_SITE_OTHER): Payer: Medicare Other

## 2023-01-28 DIAGNOSIS — Z01810 Encounter for preprocedural cardiovascular examination: Secondary | ICD-10-CM

## 2023-01-28 DIAGNOSIS — Z01818 Encounter for other preprocedural examination: Secondary | ICD-10-CM

## 2023-01-28 LAB — ECHOCARDIOGRAM COMPLETE
AR max vel: 1.6 cm2
AV Area VTI: 1.59 cm2
AV Area mean vel: 1.53 cm2
AV Mean grad: 12.7 mmHg
AV Peak grad: 23.7 mmHg
Ao pk vel: 2.44 m/s
Height: 68 in
S' Lateral: 3.2 cm
Weight: 3648 [oz_av]

## 2023-01-28 MED ORDER — TECHNETIUM TC 99M TETROFOSMIN IV KIT
32.7000 | PACK | Freq: Once | INTRAVENOUS | Status: AC | PRN
  Administered 2023-01-28: 32.7 via INTRAVENOUS

## 2023-01-28 MED ORDER — REGADENOSON 0.4 MG/5ML IV SOLN
0.4000 mg | Freq: Once | INTRAVENOUS | Status: AC
Start: 2023-01-28 — End: 2023-01-28
  Administered 2023-01-28: 0.4 mg via INTRAVENOUS

## 2023-01-28 MED ORDER — TECHNETIUM TC 99M TETROFOSMIN IV KIT
10.5000 | PACK | Freq: Once | INTRAVENOUS | Status: AC | PRN
  Administered 2023-01-28: 10.5 via INTRAVENOUS

## 2023-01-28 MED ORDER — PERFLUTREN LIPID MICROSPHERE
1.0000 mL | INTRAVENOUS | Status: AC | PRN
Start: 2023-01-28 — End: 2023-01-28
  Administered 2023-01-28: 6 mL via INTRAVENOUS

## 2023-01-29 ENCOUNTER — Telehealth: Payer: Self-pay

## 2023-01-29 LAB — MYOCARDIAL PERFUSION IMAGING
LV dias vol: 105 mL (ref 62–150)
LV sys vol: 37 mL
Nuc Stress EF: 64 %
Peak HR: 65 {beats}/min
Rest HR: 50 {beats}/min
Rest Nuclear Isotope Dose: 10.5 mCi
SDS: 0
SRS: 0
SSS: 0
ST Depression (mm): 0 mm
Stress Nuclear Isotope Dose: 32.7 mCi
TID: 1.24

## 2023-01-29 MED ORDER — EMPAGLIFLOZIN 10 MG PO TABS: 10.0000 mg | ORAL_TABLET | Freq: Every day | ORAL | 12 refills | Status: DC

## 2023-01-29 NOTE — Telephone Encounter (Signed)
-----   Message from Flossie Dibble, NP sent at 01/28/2023  4:55 PM EDT ----- Your echo showed that your heart is squeezing well, but a little stiff when it relaxes. 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 I would like to start you on Jardiance 10 mg once daily. This will help support your heart and may help with the SOB. It should help remove some of the fluid from your body.   Will let you know the results of your stress test once we get it back.

## 2023-01-30 ENCOUNTER — Telehealth: Payer: Self-pay | Admitting: *Deleted

## 2023-01-30 NOTE — Telephone Encounter (Signed)
Spoke with pt and let him know he is cleared for surgery and gave him the details. Offered to push his appt out for 3 months but pt wanted to keep the appt set for the end of this month. No further questions at this time.

## 2023-01-30 NOTE — Telephone Encounter (Signed)
-----   Message from Flossie Dibble, NP sent at 01/30/2023  8:22 AM EDT ----- Benjamin Andrade, Your stress test was normal, this is good news.  We can go ahead and clear you for surgery.  I will send a note to the surgeons office.  For your Plavix, you can hold for 5 days prior to surgery . Aspirin, we prefer you continue this throughout the surgery, however if your surgeon insists, you can hold this for 7 days prior to surgery.  If you are feeling ok from a cardiac perspective, you can push your appointment out to three months from now.  Best, Victorino Dike

## 2023-02-11 NOTE — Progress Notes (Signed)
Surgery orders requested via Epic inbox. °

## 2023-02-12 ENCOUNTER — Encounter (HOSPITAL_COMMUNITY): Payer: Self-pay

## 2023-02-13 ENCOUNTER — Ambulatory Visit (HOSPITAL_COMMUNITY): Payer: Self-pay | Admitting: Emergency Medicine

## 2023-02-13 DIAGNOSIS — G8929 Other chronic pain: Secondary | ICD-10-CM

## 2023-02-13 NOTE — H&P (View-Only) (Signed)
TOTAL KNEE ADMISSION H&P  Patient is being admitted for left total knee arthroplasty.  Subjective:  Chief Complaint:left knee pain.  HPI: Benjamin Andrade, 88 y.o. male, has a history of pain and functional disability in the left knee due to arthritis and has failed non-surgical conservative treatments for greater than 12 weeks to includeNSAID's and/or analgesics, corticosteriod injections, viscosupplementation injections, use of assistive devices, and activity modification.  Onset of symptoms was gradual, starting  1.5  years ago with gradually worsening course since that time. The patient noted no past surgery on the left knee(s).  Patient currently rates pain in the left knee(s) at 10 out of 10 with activity. Patient has night pain, worsening of pain with activity and weight bearing, pain that interferes with activities of daily living, and pain with passive range of motion.  Patient has evidence of periarticular osteophytes and joint space narrowing by imaging studies.  There is no active infection.  Patient Active Problem List   Diagnosis Date Noted   Severe obesity (BMI 35.0-35.9 with comorbidity) (HCC) 11/28/2022   Cellulitis of left lower leg 03/07/2021   Localized swelling of left lower extremity 03/07/2021   Right-sided chest wall pain 03/07/2021   Cataract 05/15/2020   Hypertension    Myocardial infarction (HCC)    GERD (gastroesophageal reflux disease)    Dysrhythmia    Diabetes mellitus without complication (HCC)    Coronary artery disease    Cancer (HCC)    Arthritis    Screening for colon cancer 05/27/2019   Bradycardia 02/09/2019   H/O heart artery stent 05/26/2017   Chronic insomnia 05/09/2017   GERD without esophagitis 05/09/2017   Mixed dyslipidemia 05/09/2017   Type 2 diabetes mellitus with hyperglycemia, without long-term current use of insulin (HCC) 05/09/2017   Vitamin D deficiency 05/09/2017   Chronic gout without tophus 02/21/2017   Carotid stenosis, bilateral  05/09/2016   Rotator cuff arthropathy 03/28/2016   Coronary artery disease involving native coronary artery of native heart with angina pectoris (HCC) 08/22/2015   Essential hypertension 08/22/2015   Hyperlipemia 08/22/2015   Past Medical History:  Diagnosis Date   Arthritis    Cancer (HCC)    skin-lip-face   Carotid stenosis, bilateral 05/09/2016   Cataract 05/15/2020   Formatting of this note might be different from the original. 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 (HCC) 08/22/2015   Overview:  80% CX - DES oct 2014 80% CX - DES oct 2014   Diabetes mellitus without complication (HCC)    prediabetic   Dysrhythmia    occ   Essential hypertension 08/22/2015   GERD (gastroesophageal reflux disease)    GERD without esophagitis 05/09/2017   H/O heart artery stent 05/26/2017   Hyperlipemia 08/22/2015   Hypertension    Myocardial infarction (HCC)    Screening for colon cancer 05/27/2019   Type 2 diabetes mellitus with hyperglycemia, without long-term current use of insulin (HCC) 05/09/2017    Past Surgical History:  Procedure Laterality Date   BACK SURGERY     CAROTID ENDARTERECTOMY Left    CATARACT EXTRACTION     INGUINAL HERNIA REPAIR     KNEE ARTHROSCOPY Right    REVERSE SHOULDER ARTHROPLASTY Right 03/28/2016   Procedure: REVERSE SHOULDER ARTHROPLASTY;  Surgeon: Scott Gregory Dean, MD;  Location: [facility] OR;  Service: Orthopedics;  Laterality: Right;   SHOULDER ARTHROSCOPY W/ ROTATOR CUFF REPAIR Right      SKIN BIOPSY      Current Outpatient Medications  Medication Sig Dispense Refill Last Dose   albuterol (PROVENTIL HFA;VENTOLIN HFA) 108 (90 Base) MCG/ACT inhaler Inhale 2 puffs into the lungs every 6 (six) hours as needed for wheezing.      amLODipine (NORVASC) 5 MG tablet Take by mouth. Take 0.5 tablet ( 2.5 mg ) morning and 5 mg night      aspirin EC 81 MG tablet Take 81 mg by  mouth daily.      carvedilol (COREG) 25 MG tablet Take 25 mg by mouth daily.      clopidogrel (PLAVIX) 75 MG tablet Take 75 mg by mouth every morning.       diclofenac (VOLTAREN) 75 MG EC tablet Take 756 mg by mouth 2 (two) times daily.      empagliflozin (JARDIANCE) 10 MG TABS tablet Take 1 tablet (10 mg total) by mouth daily before breakfast. 30 tablet 12    furosemide (LASIX) 20 MG tablet Takes 20 mg (1 tablets) my mouth twice daily for 3 days then 20 mg (1 tablet) daily as needed for swelling. 90 tablet 1    ipratropium-albuterol (DUONEB) 0.5-2.5 (3) MG/3ML SOLN Inhale 3 mLs into the lungs every 6 (six) hours as needed.      Multiple Vitamin (MULTI-VITAMINS) TABS Take 1 tablet by mouth every morning.       pantoprazole (PROTONIX) 40 MG tablet Take 40 mg by mouth every morning.       pravastatin (PRAVACHOL) 20 MG tablet TAKE 1 TABLET BY MOUTH IN THE  EVENING 100 tablet 2    telmisartan (MICARDIS) 40 MG tablet TAKE 1 TABLET BY MOUTH  DAILY 90 tablet 3    No current facility-administered medications for this visit.   Facility-Administered Medications Ordered in Other Visits  Medication Dose Route Frequency Provider Last Rate Last Admin   chlorhexidine (HIBICLENS) 4 % liquid 4 application  60 mL Topical Once Dean, Gregory Scott, MD       chlorhexidine (HIBICLENS) 4 % liquid 4 application  60 mL Topical Once Dean, Gregory Scott, MD       Allergies  Allergen Reactions   No Known Allergies     Reported from [facility] History    Social History   Tobacco Use   Smoking status: Former    Packs/day: 2.00    Years: 49.00    Additional pack years: 0.00    Total pack years: 98.00    Types: Cigarettes    Quit date: 03/22/1988    Years since quitting: 34.9    Passive exposure: Past   Smokeless tobacco: Never  Substance Use Topics   Alcohol use: No    Family History  Problem Relation Age of Onset   Cancer Mother    Cancer Father    Cancer Sister    Coronary artery disease Sister     Cancer Sister      Review of Systems  Musculoskeletal:  Positive for arthralgias.  All other systems reviewed and are negative.   Objective:  Physical Exam Constitutional:      General: He is not in acute distress.    Appearance: Normal appearance. He is normal weight.  HENT:     Head: Normocephalic and atraumatic.  Eyes:     Extraocular Movements: Extraocular movements intact.     Conjunctiva/sclera: Conjunctivae normal.     Pupils: Pupils are equal, round, and reactive to light.  Cardiovascular:     Rate and Rhythm: Regular   rhythm. Bradycardia present.     Pulses: Normal pulses.     Heart sounds: Murmur (at baseline) heard.  Pulmonary:     Effort: Pulmonary effort is normal. No respiratory distress.     Breath sounds: Normal breath sounds.  Abdominal:     General: Bowel sounds are normal. There is no distension.     Palpations: Abdomen is soft.     Tenderness: There is no abdominal tenderness.  Musculoskeletal:        General: Tenderness present.     Cervical back: Normal range of motion and neck supple.     Comments: Left knee TTP along joint line, medial worse than lateral.  Bilateral varus deformity.  No swelling or erythema.  ROM 0-110 bilaterally with elicited mild pain on left knee.  1+ nonpitting edema of BLE at baseline, and neurovascularly grossly intact.  Mildly antalgic gait.  No lesion over area of chief complaint.  Lymphadenopathy:     Cervical: No cervical adenopathy.  Skin:    General: Skin is warm and dry.     Capillary Refill: Capillary refill takes less than 2 seconds.     Findings: No erythema or rash.  Neurological:     General: No focal deficit present.     Mental Status: He is alert and oriented to person, place, and time.  Psychiatric:        Mood and Affect: Mood normal.        Behavior: Behavior normal.     Vital signs in last 24 hours: @VSRANGES@  Labs:   Estimated body mass index is 34.67 kg/m as calculated from the following:    Height as of 01/28/23: 5' 8" (1.727 m).   Weight as of 01/28/23: 103.4 kg.   Imaging Review Plain radiographs demonstrate  moderate to severe  degenerative joint disease of the left knee(s), medial worse than lateral and with mild subluxation. The overall alignment is bilateral varus . The bone quality appears to be good for age and reported activity level.    Assessment/Plan:  End stage arthritis, left knee   The patient history, physical examination, clinical judgment of the provider and imaging studies are consistent with end stage degenerative joint disease of the left knee(s) and total knee arthroplasty is deemed medically necessary. The treatment options including medical management, injection therapy arthroscopy and arthroplasty were discussed at length. The risks and benefits of total knee arthroplasty were presented and reviewed. The risks due to aseptic loosening, infection, stiffness, patella tracking problems, thromboembolic complications and other imponderables were discussed. The patient acknowledged the explanation, agreed to proceed with the plan and consent was signed. Patient is being admitted for inpatient treatment for surgery, pain control, PT, OT, prophylactic antibiotics, VTE prophylaxis, progressive ambulation and ADL's and discharge planning. The patient is planning to be discharged home with Home Health PT.   Anticipated LOS equal to or greater than 2 midnights due to - Age 65 and older with one or more of the following:  - Obesity  - Expected need for hospital services (PT, OT, Nursing) required for safe  discharge  - Anticipated need for postoperative skilled nursing care or inpatient rehab  - Active co-morbidities: Diabetes and Heart Attack OR   - Unanticipated findings during/Post Surgery: None  - Patient is a high risk of re-admission due to: None 

## 2023-02-13 NOTE — H&P (Signed)
TOTAL KNEE ADMISSION H&P  Patient is being admitted for left total knee arthroplasty.  Subjective:  Chief Complaint:left knee pain.  HPI: Benjamin Andrade, 88 y.o. male, has a history of pain and functional disability in the left knee due to arthritis and has failed non-surgical conservative treatments for greater than 12 weeks to includeNSAID's and/or analgesics, corticosteriod injections, viscosupplementation injections, use of assistive devices, and activity modification.  Onset of symptoms was gradual, starting  1.5  years ago with gradually worsening course since that time. The patient noted no past surgery on the left knee(s).  Patient currently rates pain in the left knee(s) at 10 out of 10 with activity. Patient has night pain, worsening of pain with activity and weight bearing, pain that interferes with activities of daily living, and pain with passive range of motion.  Patient has evidence of periarticular osteophytes and joint space narrowing by imaging studies.  There is no active infection.  Patient Active Problem List   Diagnosis Date Noted   Severe obesity (BMI 35.0-35.9 with comorbidity) (HCC) 11/28/2022   Cellulitis of left lower leg 03/07/2021   Localized swelling of left lower extremity 03/07/2021   Right-sided chest wall pain 03/07/2021   Cataract 05/15/2020   Hypertension    Myocardial infarction [facility])    GERD (gastroesophageal reflux disease)    Dysrhythmia    Diabetes mellitus without complication (HCC)    Coronary artery disease    Cancer (HCC)    Arthritis    Screening for colon cancer 05/27/2019   Bradycardia 02/09/2019   H/O heart artery stent 05/26/2017   Chronic insomnia 05/09/2017   GERD without esophagitis 05/09/2017   Mixed dyslipidemia 05/09/2017   Type 2 diabetes mellitus with hyperglycemia, without long-term current use of insulin (HCC) 05/09/2017   Vitamin D deficiency 05/09/2017   Chronic gout without tophus 02/21/2017   Carotid stenosis, bilateral  05/09/2016   Rotator cuff arthropathy 03/28/2016   Coronary artery disease involving native coronary artery of native heart with angina pectoris (HCC) 08/22/2015   Essential hypertension 08/22/2015   Hyperlipemia 08/22/2015   Past Medical History:  Diagnosis Date   Arthritis    Cancer (HCC)    skin-lip-face   Carotid stenosis, bilateral 05/09/2016   Cataract 05/15/2020   Formatting of this note might be different from the original. 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 (HCC) 08/22/2015   Overview:  80% CX - DES oct 2014 80% CX - DES oct 2014   Diabetes mellitus without complication (HCC)    prediabetic   Dysrhythmia    occ   Essential hypertension 08/22/2015   GERD (gastroesophageal reflux disease)    GERD without esophagitis 05/09/2017   H/O heart artery stent 05/26/2017   Hyperlipemia 08/22/2015   Hypertension    Myocardial infarction (HCC)    Screening for colon cancer 05/27/2019   Type 2 diabetes mellitus with hyperglycemia, without long-term current use of insulin (HCC) 05/09/2017    Past Surgical History:  Procedure Laterality Date   BACK SURGERY     CAROTID ENDARTERECTOMY Left    CATARACT EXTRACTION     INGUINAL HERNIA REPAIR     KNEE ARTHROSCOPY Right    REVERSE SHOULDER ARTHROPLASTY Right 03/28/2016   Procedure: REVERSE SHOULDER ARTHROPLASTY;  Surgeon: Cammy Copa, MD;  Location: [facility] OR;  Service: Orthopedics;  Laterality: Right;   SHOULDER ARTHROSCOPY W/ ROTATOR CUFF REPAIR Right  SKIN BIOPSY      Current Outpatient Medications  Medication Sig Dispense Refill Last Dose   albuterol (PROVENTIL HFA;VENTOLIN HFA) 108 (90 Base) MCG/ACT inhaler Inhale 2 puffs into the lungs every 6 (six) hours as needed for wheezing.      amLODipine (NORVASC) 5 MG tablet Take by mouth. Take 0.5 tablet ( 2.5 mg ) morning and 5 mg night      aspirin EC 81 MG tablet Take 81 mg by  mouth daily.      carvedilol (COREG) 25 MG tablet Take 25 mg by mouth daily.      clopidogrel (PLAVIX) 75 MG tablet Take 75 mg by mouth every morning.       diclofenac (VOLTAREN) 75 MG EC tablet Take 756 mg by mouth 2 (two) times daily.      empagliflozin (JARDIANCE) 10 MG TABS tablet Take 1 tablet (10 mg total) by mouth daily before breakfast. 30 tablet 12    furosemide (LASIX) 20 MG tablet Takes 20 mg (1 tablets) my mouth twice daily for 3 days then 20 mg (1 tablet) daily as needed for swelling. 90 tablet 1    ipratropium-albuterol (DUONEB) 0.5-2.5 (3) MG/3ML SOLN Inhale 3 mLs into the lungs every 6 (six) hours as needed.      Multiple Vitamin (MULTI-VITAMINS) TABS Take 1 tablet by mouth every morning.       pantoprazole (PROTONIX) 40 MG tablet Take 40 mg by mouth every morning.       pravastatin (PRAVACHOL) 20 MG tablet TAKE 1 TABLET BY MOUTH IN THE  EVENING 100 tablet 2    telmisartan (MICARDIS) 40 MG tablet TAKE 1 TABLET BY MOUTH  DAILY 90 tablet 3    No current facility-administered medications for this visit.   Facility-Administered Medications Ordered in Other Visits  Medication Dose Route Frequency Provider Last Rate Last Admin   chlorhexidine (HIBICLENS) 4 % liquid 4 application  60 mL Topical Once Cammy Copa, MD       chlorhexidine (HIBICLENS) 4 % liquid 4 application  60 mL Topical Once Cammy Copa, MD       Allergies  Allergen Reactions   No Known Allergies     Reported from [facility] History    Social History   Tobacco Use   Smoking status: Former    Packs/day: 2.00    Years: 49.00    Additional pack years: 0.00    Total pack years: 98.00    Types: Cigarettes    Quit date: 03/22/1988    Years since quitting: 34.9    Passive exposure: Past   Smokeless tobacco: Never  Substance Use Topics   Alcohol use: No    Family History  Problem Relation Age of Onset   Cancer Mother    Cancer Father    Cancer Sister    Coronary artery disease Sister     Cancer Sister      Review of Systems  Musculoskeletal:  Positive for arthralgias.  All other systems reviewed and are negative.   Objective:  Physical Exam Constitutional:      General: He is not in acute distress.    Appearance: Normal appearance. He is normal weight.  HENT:     Head: Normocephalic and atraumatic.  Eyes:     Extraocular Movements: Extraocular movements intact.     Conjunctiva/sclera: Conjunctivae normal.     Pupils: Pupils are equal, round, and reactive to light.  Cardiovascular:     Rate and Rhythm: Regular  rhythm. Bradycardia present.     Pulses: Normal pulses.     Heart sounds: Murmur (at baseline) heard.  Pulmonary:     Effort: Pulmonary effort is normal. No respiratory distress.     Breath sounds: Normal breath sounds.  Abdominal:     General: Bowel sounds are normal. There is no distension.     Palpations: Abdomen is soft.     Tenderness: There is no abdominal tenderness.  Musculoskeletal:        General: Tenderness present.     Cervical back: Normal range of motion and neck supple.     Comments: Left knee TTP along joint line, medial worse than lateral.  Bilateral varus deformity.  No swelling or erythema.  ROM 0-110 bilaterally with elicited mild pain on left knee.  1+ nonpitting edema of BLE at baseline, and neurovascularly grossly intact.  Mildly antalgic gait.  No lesion over area of chief complaint.  Lymphadenopathy:     Cervical: No cervical adenopathy.  Skin:    General: Skin is warm and dry.     Capillary Refill: Capillary refill takes less than 2 seconds.     Findings: No erythema or rash.  Neurological:     General: No focal deficit present.     Mental Status: He is alert and oriented to person, place, and time.  Psychiatric:        Mood and Affect: Mood normal.        Behavior: Behavior normal.     Vital signs in last 24 hours: @VSRANGES @  Labs:   Estimated body mass index is 34.67 kg/m as calculated from the following:    Height as of 01/28/23: 5\' 8"  (1.727 m).   Weight as of 01/28/23: 103.4 kg.   Imaging Review Plain radiographs demonstrate  moderate to severe  degenerative joint disease of the left knee(s), medial worse than lateral and with mild subluxation. The overall alignment is bilateral varus . The bone quality appears to be good for age and reported activity level.    Assessment/Plan:  End stage arthritis, left knee   The patient history, physical examination, clinical judgment of the provider and imaging studies are consistent with end stage degenerative joint disease of the left knee(s) and total knee arthroplasty is deemed medically necessary. The treatment options including medical management, injection therapy arthroscopy and arthroplasty were discussed at length. The risks and benefits of total knee arthroplasty were presented and reviewed. The risks due to aseptic loosening, infection, stiffness, patella tracking problems, thromboembolic complications and other imponderables were discussed. The patient acknowledged the explanation, agreed to proceed with the plan and consent was signed. Patient is being admitted for inpatient treatment for surgery, pain control, PT, OT, prophylactic antibiotics, VTE prophylaxis, progressive ambulation and ADL's and discharge planning. The patient is planning to be discharged home with Home Health PT.   Anticipated LOS equal to or greater than 2 midnights due to - Age 59 and older with one or more of the following:  - Obesity  - Expected need for hospital services (PT, OT, Nursing) required for safe  discharge  - Anticipated need for postoperative skilled nursing care or inpatient rehab  - Active co-morbidities: Diabetes and Heart Attack OR   - Unanticipated findings during/Post Surgery: None  - Patient is a high risk of re-admission due to: None

## 2023-02-14 ENCOUNTER — Ambulatory Visit: Payer: Medicare Other | Admitting: Cardiology

## 2023-02-17 NOTE — Progress Notes (Signed)
COVID Vaccine Completed: yes  Date of COVID positive in last 90 days:  PCP - Consuello Masse, MD Cardiologist - Norman Herrlich, MD  Chest x-ray -  EKG - 01/10/23 [place] Foley at 50 Stress Test - 01/28/23 Epic ECHO - 01/28/23 Epic Cardiac Cath -  Pacemaker/ICD device last checked: Spinal Cord Stimulator:  Bowel Prep -   Sleep Study - ?? CPAP -   Fasting Blood Sugar -  Checks Blood Sugar _____ times a day  Last dose of GLP1 agonist-  N/A GLP1 instructions:  N/A   Last dose of SGLT-2 inhibitors-  N/A SGLT-2 instructions: N/A   Blood Thinner Instructions:  Plavix, hold 5 days Aspirin Instructions: ASA 81, hold 7 days Last Dose:  Activity level:  Can go up a flight of stairs and perform activities of daily living without stopping and without symptoms of chest pain or shortness of breath.  Able to exercise without symptoms  Unable to go up a flight of stairs without symptoms of     Anesthesia review: heart stent , CAD, HTN, DM2, MI  Patient denies shortness of breath, fever, cough and chest pain at PAT appointment  Patient verbalized understanding of instructions that were given to them at the PAT appointment. Patient was also instructed that they will need to review over the PAT instructions again at home before surgery.

## 2023-02-18 ENCOUNTER — Encounter (HOSPITAL_COMMUNITY)
Admission: RE | Admit: 2023-02-18 | Discharge: 2023-02-18 | Disposition: A | Discharge status: Home / Self Care | Payer: Medicare Other | Source: Ambulatory Visit | Attending: Orthopedic Surgery | Admitting: Orthopedic Surgery

## 2023-02-18 ENCOUNTER — Other Ambulatory Visit: Payer: Self-pay

## 2023-02-18 ENCOUNTER — Encounter (HOSPITAL_COMMUNITY): Payer: Self-pay

## 2023-02-18 VITALS — BP 183/88 | HR 50 | Temp 97.8°F | Resp 14 | Ht 66.0 in | Wt 222.6 lb

## 2023-02-18 DIAGNOSIS — E119 Type 2 diabetes mellitus without complications: Secondary | ICD-10-CM | POA: Insufficient documentation

## 2023-02-18 DIAGNOSIS — I110 Hypertensive heart disease with heart failure: Secondary | ICD-10-CM | POA: Insufficient documentation

## 2023-02-18 DIAGNOSIS — Z01818 Encounter for other preprocedural examination: Secondary | ICD-10-CM

## 2023-02-18 DIAGNOSIS — I2510 Atherosclerotic heart disease of native coronary artery without angina pectoris: Secondary | ICD-10-CM | POA: Insufficient documentation

## 2023-02-18 DIAGNOSIS — Z7982 Long term (current) use of aspirin: Secondary | ICD-10-CM | POA: Diagnosis not present

## 2023-02-18 DIAGNOSIS — Z87891 Personal history of nicotine dependence: Secondary | ICD-10-CM | POA: Diagnosis not present

## 2023-02-18 DIAGNOSIS — Z955 Presence of coronary angioplasty implant and graft: Secondary | ICD-10-CM | POA: Insufficient documentation

## 2023-02-18 DIAGNOSIS — M1712 Unilateral primary osteoarthritis, left knee: Secondary | ICD-10-CM | POA: Insufficient documentation

## 2023-02-18 DIAGNOSIS — Z01812 Encounter for preprocedural laboratory examination: Secondary | ICD-10-CM | POA: Insufficient documentation

## 2023-02-18 DIAGNOSIS — I5030 Unspecified diastolic (congestive) heart failure: Secondary | ICD-10-CM | POA: Insufficient documentation

## 2023-02-18 DIAGNOSIS — G8929 Other chronic pain: Secondary | ICD-10-CM

## 2023-02-18 LAB — TYPE AND SCREEN
ABO/RH(D): O POS
Antibody Screen: NEGATIVE

## 2023-02-18 LAB — CBC WITH DIFFERENTIAL/PLATELET
Abs Immature Granulocytes: 0.01 10*3/uL (ref 0.00–0.07)
Basophils Absolute: 0.1 10*3/uL (ref 0.0–0.1)
Basophils Relative: 1 %
Eosinophils Absolute: 0.3 10*3/uL (ref 0.0–0.5)
Eosinophils Relative: 4 %
HCT: 45.8 % (ref 39.0–52.0)
Hemoglobin: 15.3 g/dL (ref 13.0–17.0)
Immature Granulocytes: 0 %
Lymphocytes Relative: 40 %
Lymphs Abs: 3.4 10*3/uL (ref 0.7–4.0)
MCH: 32.9 pg (ref 26.0–34.0)
MCHC: 33.4 g/dL (ref 30.0–36.0)
MCV: 98.5 fL (ref 80.0–100.0)
Monocytes Absolute: 1.3 10*3/uL — ABNORMAL HIGH (ref 0.1–1.0)
Monocytes Relative: 15 %
Neutro Abs: 3.4 10*3/uL (ref 1.7–7.7)
Neutrophils Relative %: 40 %
Platelets: 229 10*3/uL (ref 150–400)
RBC: 4.65 MIL/uL (ref 4.22–5.81)
RDW: 13.1 % (ref 11.5–15.5)
WBC: 8.6 10*3/uL (ref 4.0–10.5)
nRBC: 0 % (ref 0.0–0.2)

## 2023-02-18 LAB — COMPREHENSIVE METABOLIC PANEL
ALT: 18 U/L (ref 0–44)
AST: 23 U/L (ref 15–41)
Albumin: 3.9 g/dL (ref 3.5–5.0)
Alkaline Phosphatase: 51 U/L (ref 38–126)
Anion gap: 8 (ref 5–15)
BUN: 16 mg/dL (ref 8–23)
CO2: 25 mmol/L (ref 22–32)
Calcium: 9.1 mg/dL (ref 8.9–10.3)
Chloride: 104 mmol/L (ref 98–111)
Creatinine, Ser: 0.99 mg/dL (ref 0.61–1.24)
GFR, Estimated: >60 mL/min (ref >60)
Glucose, Bld: 94 mg/dL (ref 70–99)
Potassium: 4.9 mmol/L (ref 3.5–5.1)
Sodium: 137 mmol/L (ref 135–145)
Total Bilirubin: 0.9 mg/dL (ref 0.3–1.2)
Total Protein: 6.7 g/dL (ref 6.5–8.1)

## 2023-02-18 LAB — SURGICAL PCR SCREEN
MRSA, PCR: NEGATIVE
Staphylococcus aureus: NEGATIVE

## 2023-02-18 LAB — GLUCOSE, CAPILLARY: Glucose-Capillary: 95 mg/dL (ref 70–99)

## 2023-02-18 NOTE — Patient Instructions (Addendum)
SURGICAL WAITING ROOM VISITATION  Patients having surgery or a procedure may have no more than 2 support people in the waiting area - these visitors may rotate.    Children under the age of 58 must have an adult with them who is not the patient.  Due to an increase in RSV and influenza rates and associated hospitalizations, children ages 31 and under may not visit patients in [facility] hospitals.  If the patient needs to stay at the hospital during part of their recovery, the visitor guidelines for inpatient rooms apply. Pre-op nurse will coordinate an appropriate time for 1 support person to accompany patient in pre-op.  This support person may not rotate.    Please refer to the [facility] website for the visitor guidelines for Inpatients (after your surgery is over and you are in a regular room).    Your procedure is scheduled on: 03/03/23   Report to [facility] Main Entrance    Report to admitting at 10:10 AM   Call this number if you have problems the morning of surgery [phone]   Do not eat food :After Midnight.   After Midnight you may have the following liquids until 9:40 AM DAY OF SURGERY  Water Non-Citrus Juices (without pulp, NO RED-Apple, White grape, White cranberry) Black Coffee (NO MILK/CREAM OR CREAMERS, sugar ok)  Clear Tea (NO MILK/CREAM OR CREAMERS, sugar ok) regular and decaf                             Plain Jell-O (NO RED)                                           Fruit ices (not with fruit pulp, NO RED)                                     Popsicles (NO RED)                                                               Sports drinks like Gatorade (NO RED)                  The day of surgery:  Drink ONE (1) Pre-Surgery G2 at 9:40 AM the morning of surgery. Drink in one sitting. Do not sip.  This drink was given to you during your hospital  pre-op appointment visit. Nothing else to drink after completing the  Pre-Surgery G2.          If you  have questions, please contact your surgeon's office.   FOLLOW BOWEL PREP AND ANY ADDITIONAL PRE OP INSTRUCTIONS YOU RECEIVED FROM YOUR SURGEON'S OFFICE!!!     Oral Hygiene is also important to reduce your risk of infection.                                    Remember - BRUSH YOUR TEETH THE MORNING OF SURGERY WITH YOUR REGULAR TOOTHPASTE  DENTURES WILL BE  REMOVED PRIOR TO SURGERY PLEASE DO NOT APPLY "Poly grip" OR ADHESIVES!!!   Take these medicines the morning of surgery with A SIP OF WATER: Amlodipine, Finasteride  These are anesthesia recommendations for holding your anticoagulants.  Please contact your prescribing physician to confirm IF it is safe to hold your anticoagulants for this length of time.   Eliquis Apixaban   72 hours   Xarelto Rivaroxaban   72 hours  Plavix Clopidogrel   120 hours  Pletal Cilostazol   120 hours    How to Manage Your Diabetes Before and After Surgery  Why is it important to control my blood sugar before and after surgery? Improving blood sugar levels before and after surgery helps healing and can limit problems. A way of improving blood sugar control is eating a healthy diet by:  Eating less sugar and carbohydrates  Increasing activity/exercise  Talking with your doctor about reaching your blood sugar goals High blood sugars (greater than 180 mg/dL) can raise your risk of infections and slow your recovery, so you will need to focus on controlling your diabetes during the weeks before surgery. Make sure that the doctor who takes care of your diabetes knows about your planned surgery including the date and location.  How do I manage my blood sugar before surgery? Check your blood sugar at least 4 times a day, starting 2 days before surgery, to make sure that the level is not too high or low. Check your blood sugar the morning of your surgery when you wake up and every 2 hours until you get to the Short Stay unit. If your blood sugar is less than 70  mg/dL, you will need to treat for low blood sugar: Do not take insulin. Treat a low blood sugar (less than 70 mg/dL) with  cup of clear juice (cranberry or apple), 4 glucose tablets, OR glucose gel. Recheck blood sugar in 15 minutes after treatment (to make sure it is greater than 70 mg/dL). If your blood sugar is not greater than 70 mg/dL on recheck, call [phone] for further instructions. Report your blood sugar to the short stay nurse when you get to Short Stay.  If you are admitted to the hospital after surgery: Your blood sugar will be checked by the staff and you will probably be given insulin after surgery (instead of oral diabetes medicines) to make sure you have good blood sugar levels. The goal for blood sugar control after surgery is 80-180 mg/dL.  Reviewed and Endorsed by [facility] Patient Education Committee, August 2015                              You may not have any metal on your body including jewelry, and body piercing             Do not wear lotions, powders, cologne, or deodorant              Men may shave face and neck.   Do not bring valuables to the hospital. [facility] IS NOT             RESPONSIBLE   FOR VALUABLES.   Contacts, glasses, dentures or bridgework may not be worn into surgery.   Bring small overnight bag day of surgery.   DO NOT BRING YOUR HOME MEDICATIONS TO THE HOSPITAL. PHARMACY WILL DISPENSE MEDICATIONS LISTED ON YOUR MEDICATION LIST TO YOU DURING YOUR ADMISSION IN THE HOSPITAL!  Special Instructions: Bring a copy of your healthcare power of attorney and living will documents the day of surgery if you haven't scanned them before.              Please read over the following fact sheets you were given: IF YOU HAVE QUESTIONS ABOUT YOUR PRE-OP INSTRUCTIONS PLEASE CALL [phone] [place]   If you received a COVID test during your pre-op visit  it is requested that you wear a mask when out in public, stay away from anyone that may not be  feeling well and notify your surgeon if you develop symptoms. If you test positive for Covid or have been in contact with anyone that has tested positive in the last 10 days please notify you surgeon.      Pre-operative 5 CHG Bath Instructions   You can play a key role in reducing the risk of infection after surgery. Your skin needs to be as free of germs as possible. You can reduce the number of germs on your skin by washing with CHG (chlorhexidine gluconate) soap before surgery. CHG is an antiseptic soap that kills germs and continues to kill germs even after washing.   DO NOT use if you have an allergy to chlorhexidine/CHG or antibacterial soaps. If your skin becomes reddened or irritated, stop using the CHG and notify one of our RNs at [phone].   Please shower with the CHG soap starting 4 days before surgery using the following schedule:     Please keep in mind the following:  DO NOT shave, including legs and underarms, starting the day of your first shower.   You may shave your face at any point before/day of surgery.  Place clean sheets on your bed the day you start using CHG soap. Use a clean washcloth (not used since being washed) for each shower. DO NOT sleep with pets once you start using the CHG.   CHG Shower Instructions:  If you choose to wash your hair and private area, wash first with your normal shampoo/soap.  After you use shampoo/soap, rinse your hair and body thoroughly to remove shampoo/soap residue.  Turn the water OFF and apply about 3 tablespoons (45 ml) of CHG soap to a CLEAN washcloth.  Apply CHG soap ONLY FROM YOUR NECK DOWN TO YOUR TOES (washing for 3-5 minutes)  DO NOT use CHG soap on face, private areas, open wounds, or sores.  Pay special attention to the area where your surgery is being performed.  If you are having back surgery, having someone wash your back for you may be helpful. Wait 2 minutes after CHG soap is applied, then you may rinse off the  CHG soap.  Pat dry with a clean towel  Put on clean clothes/pajamas   If you choose to wear lotion, please use ONLY the CHG-compatible lotions on the back of this paper.     Additional instructions for the day of surgery: DO NOT APPLY any lotions, deodorants, cologne, or perfumes.   Put on clean/comfortable clothes.  Brush your teeth.  Ask your nurse before applying any prescription medications to the skin.      CHG Compatible Lotions   Aveeno Moisturizing lotion  Cetaphil Moisturizing Cream  Cetaphil Moisturizing Lotion  Clairol Herbal Essence Moisturizing Lotion, Dry Skin  Clairol Herbal Essence Moisturizing Lotion, Extra Dry Skin  Clairol Herbal Essence Moisturizing Lotion, Normal Skin  Curel Age Defying Therapeutic Moisturizing Lotion with Alpha Hydroxy  Curel Extreme Care Body Lotion  Curel Soothing  Hands Moisturizing Hand Lotion  Curel Therapeutic Moisturizing Cream, Fragrance-Free  Curel Therapeutic Moisturizing Lotion, Fragrance-Free  Curel Therapeutic Moisturizing Lotion, Original Formula  Eucerin Daily Replenishing Lotion  Eucerin Dry Skin Therapy Plus Alpha Hydroxy Crme  Eucerin Dry Skin Therapy Plus Alpha Hydroxy Lotion  Eucerin Original Crme  Eucerin Original Lotion  Eucerin Plus Crme Eucerin Plus Lotion  Eucerin TriLipid Replenishing Lotion  Keri Anti-Bacterial Hand Lotion  Keri Deep Conditioning Original Lotion Dry Skin Formula Softly Scented  Keri Deep Conditioning Original Lotion, Fragrance Free Sensitive Skin Formula  Keri Lotion Fast Absorbing Fragrance Free Sensitive Skin Formula  Keri Lotion Fast Absorbing Softly Scented Dry Skin Formula  Keri Original Lotion  Keri Skin Renewal Lotion Keri Silky Smooth Lotion  Keri Silky Smooth Sensitive Skin Lotion  Nivea Body Creamy Conditioning Oil  Nivea Body Extra Enriched Lotion  Nivea Body Original Lotion  Nivea Body Sheer Moisturizing Lotion Nivea Crme  Nivea Skin Firming Lotion  NutraDerm 30 Skin  Lotion  NutraDerm Skin Lotion  NutraDerm Therapeutic Skin Cream  NutraDerm Therapeutic Skin Lotion  ProShield Protective Hand Cream  Provon moisturizing lotion   Incentive Spirometer  An incentive spirometer is a tool that can help keep your lungs clear and active. This tool measures how well you are filling your lungs with each breath. Taking long deep breaths may help reverse or decrease the chance of developing breathing (pulmonary) problems (especially infection) following: A long period of time when you are unable to move or be active. BEFORE THE PROCEDURE  If the spirometer includes an indicator to show your best effort, your nurse or respiratory therapist will set it to a desired goal. If possible, sit up straight or lean slightly forward. Try not to slouch. Hold the incentive spirometer in an upright position. INSTRUCTIONS FOR USE  Sit on the edge of your bed if possible, or sit up as far as you can in bed or on a chair. Hold the incentive spirometer in an upright position. Breathe out normally. Place the mouthpiece in your mouth and seal your lips tightly around it. Breathe in slowly and as deeply as possible, raising the piston or the ball toward the top of the column. Hold your breath for 3-5 seconds or for as long as possible. Allow the piston or ball to fall to the bottom of the column. Remove the mouthpiece from your mouth and breathe out normally. Rest for a few seconds and repeat Steps 1 through 7 at least 10 times every 1-2 hours when you are awake. Take your time and take a few normal breaths between deep breaths. The spirometer may include an indicator to show your best effort. Use the indicator as a goal to work toward during each repetition. After each set of 10 deep breaths, practice coughing to be sure your lungs are clear. If you have an incision (the cut made at the time of surgery), support your incision when coughing by placing a pillow or rolled up towels firmly  against it. Once you are able to get out of bed, walk around indoors and cough well. You may stop using the incentive spirometer when instructed by your caregiver.  RISKS AND COMPLICATIONS Take your time so you do not get dizzy or light-headed. If you are in pain, you may need to take or ask for pain medication before doing incentive spirometry. It is harder to take a deep breath if you are having pain. AFTER USE Rest and breathe slowly and easily. It can be  helpful to keep track of a log of your progress. Your caregiver can provide you with a simple table to help with this. If you are using the spirometer at home, follow these instructions: SEEK MEDICAL CARE IF:  You are having difficultly using the spirometer. You have trouble using the spirometer as often as instructed. Your pain medication is not giving enough relief while using the spirometer. You develop fever of 100.5 F (38.1 C) or higher. SEEK IMMEDIATE MEDICAL CARE IF:  You cough up bloody sputum that had not been present before. You develop fever of 102 F (38.9 C) or greater. You develop worsening pain at or near the incision site. MAKE SURE YOU:  Understand these instructions. Will watch your condition. Will get help right away if you are not doing well or get worse. Document Released: 01/13/2007 Document Revised: 11/25/2011 Document Reviewed: 03/16/2007 [employer] Patient Information 2014 [employer], [place].   ________________________________________________________________________  WHAT IS A BLOOD TRANSFUSION? Blood Transfusion Information  A transfusion is the replacement of blood or some of its parts. Blood is made up of multiple cells which provide different functions. Red blood cells carry oxygen and are used for blood loss replacement. White blood cells fight against infection. Platelets control bleeding. Plasma helps clot blood. Other blood products are available for specialized needs, such as hemophilia or other  clotting disorders. BEFORE THE TRANSFUSION  Who gives blood for transfusions?  Healthy volunteers who are fully evaluated to make sure their blood is safe. This is blood bank blood. Transfusion therapy is the safest it has ever been in the practice of medicine. Before blood is taken from a donor, a complete history is taken to make sure that person has no history of diseases nor engages in risky social behavior (examples are intravenous drug use or sexual activity with multiple partners). The donor's travel history is screened to minimize risk of transmitting infections, such as malaria. The donated blood is tested for signs of infectious diseases, such as HIV and hepatitis. The blood is then tested to be sure it is compatible with you in order to minimize the chance of a transfusion reaction. If you or a relative donates blood, this is often done in anticipation of surgery and is not appropriate for emergency situations. It takes many days to process the donated blood. RISKS AND COMPLICATIONS Although transfusion therapy is very safe and saves many lives, the main dangers of transfusion include:  Getting an infectious disease. Developing a transfusion reaction. This is an allergic reaction to something in the blood you were given. Every precaution is taken to prevent this. The decision to have a blood transfusion has been considered carefully by your caregiver before blood is given. Blood is not given unless the benefits outweigh the risks. AFTER THE TRANSFUSION Right after receiving a blood transfusion, you will usually feel much better and more energetic. This is especially true if your red blood cells have gotten low (anemic). The transfusion raises the level of the red blood cells which carry oxygen, and this usually causes an energy increase. The nurse administering the transfusion will monitor you carefully for complications. HOME CARE INSTRUCTIONS  No special instructions are needed after a  transfusion. You may find your energy is better. Speak with your caregiver about any limitations on activity for underlying diseases you may have. SEEK MEDICAL CARE IF:  Your condition is not improving after your transfusion. You develop redness or irritation at the intravenous (IV) site. SEEK IMMEDIATE MEDICAL CARE IF:  Any  of the following symptoms occur over the next 12 hours: Shaking chills. You have a temperature by mouth above 102 F (38.9 C), not controlled by medicine. Chest, back, or muscle pain. People around you feel you are not acting correctly or are confused. Shortness of breath or difficulty breathing. Dizziness and fainting. You get a rash or develop hives. You have a decrease in urine output. Your urine turns a dark color or changes to pink, red, or brown. Any of the following symptoms occur over the next 10 days: You have a temperature by mouth above 102 F (38.9 C), not controlled by medicine. Shortness of breath. Weakness after normal activity. The white part of the eye turns yellow (jaundice). You have a decrease in the amount of urine or are urinating less often. Your urine turns a dark color or changes to pink, red, or brown. Document Released: 08/30/2000 Document Revised: 11/25/2011 Document Reviewed: 04/18/2008 [facility] Patient Information 2014 [place], [place].  _______________________________________________________________________

## 2023-02-19 LAB — HEMOGLOBIN A1C
Hgb A1c MFr Bld: 6 % — ABNORMAL HIGH (ref 4.8–5.6)
Mean Plasma Glucose: 126 mg/dL

## 2023-02-20 NOTE — Anesthesia Preprocedure Evaluation (Addendum)
Anesthesia Evaluation  Patient identified by MRN, date of birth, ID band Patient awake    Reviewed: Allergy & Precautions, NPO status , Patient's Chart, lab work & pertinent test results  Airway Mallampati: II  TM Distance: >3 FB Neck ROM: Full    Dental  (+) Dental Advisory Given   Pulmonary former smoker   breath sounds clear to auscultation       Cardiovascular hypertension, Pt. on medications and Pt. on home beta blockers + CAD, + Past MI and + Cardiac Stents  + dysrhythmias  Rhythm:Regular Rate:Normal     Neuro/Psych negative neurological ROS     GI/Hepatic Neg liver ROS,GERD  ,,  Endo/Other  diabetes    Renal/GU negative Renal ROS     Musculoskeletal  (+) Arthritis ,    Abdominal   Peds  Hematology negative hematology ROS (+) Last plavix 6/9   Anesthesia Other Findings   Reproductive/Obstetrics                              Lab Results  Component Value Date   WBC 8.6 02/18/2023   HGB 15.3 02/18/2023   HCT 45.8 02/18/2023   MCV 98.5 02/18/2023   PLT 229 02/18/2023   Lab Results  Component Value Date   CREATININE 0.99 02/18/2023   BUN 16 02/18/2023   NA 137 02/18/2023   K 4.9 02/18/2023   CL 104 02/18/2023   CO2 25 02/18/2023    Anesthesia Physical Anesthesia Plan  ASA: 3  Anesthesia Plan: Spinal   Post-op Pain Management: Tylenol PO (pre-op)* and Regional block*   Induction:   PONV Risk Score and Plan: 1 and Propofol infusion, Dexamethasone, Ondansetron and Treatment may vary due to age or medical condition  Airway Management Planned: Natural Airway and Simple Face Mask  Additional Equipment:   Intra-op Plan:   Post-operative Plan:   Informed Consent: I have reviewed the patients History and Physical, chart, labs and discussed the procedure including the risks, benefits and alternatives for the proposed anesthesia with the patient or authorized  representative who has indicated his/her understanding and acceptance.       Plan Discussed with: CRNA  Anesthesia Plan Comments:         Anesthesia Quick Evaluation

## 2023-02-20 NOTE — Progress Notes (Signed)
Anesthesia Chart Review   Case: [id number] Date/Time: 03/03/23 1227   Procedure: TOTAL KNEE ARTHROPLASTY (Left: Knee)   Anesthesia type: Spinal   Pre-op diagnosis: OA LEFT KNEE   Location: [facility] 07 / [facility]   Surgeons: Joen Laura, MD       DISCUSSION:88 y.o. former smoker with h/o HTN, DM II, CAD PCI in 2014 with DES to [facility], bradycardia, left knee OA scheduled for above procedure 03/03/2023 with Dr. Weber Cooks.   Pt seen by cardiology 01/13/23. Per OV note, "Preoperative evaluation - According to the Revised Cardiac Risk Index (RCRI), his Perioperative Risk of Major Cardiac Event is (%): 0.9 His Functional Capacity in METs is: 4.4 according to the [facility] Activity Status Index (DASI). He will need above testing completed prior to recommendations for proceeding with surgery. Will route this to scheduling surgeon so they are aware.    **addendum 01/31/23 - repeat echocardiogram showed HFpEF, he was started on Jardiance. Stress test was without ischemia, therefore he can proceed with surgery at an acceptable risk. Regarding his aspirin therapy, we prefer he continue throughout the perioperative stay, however if it is deemed too high of a risk by the surgeon, he can hold his ASA for 7 days and resume when directed to by the surgeon. Regarding his Plavix, he may hold this for 5 days, resume when directed to by surgeon, preferably as soon as possible."  Pt reports last dose of Plavix 02/24/2023.   Anticipate pt can proceed with planned procedure barring acute status change.   VS: BP (!) 183/88   Pulse (!) 50   Temp 36.6 C (Oral)   Resp 14   Ht 5\' 6"  (1.676 m)   Wt 101 kg   SpO2 97%   BMI 35.93 kg/m   PROVIDERS: Hadley Pen, MD is PCP   Cardiologist - Norman Herrlich, MD  LABS: Labs reviewed: Acceptable for surgery. (all labs ordered are listed, but only abnormal results are displayed)  Labs Reviewed  CBC WITH DIFFERENTIAL/PLATELET - Abnormal; Notable for the following  components:      Result Value   Monocytes Absolute 1.3 (*)    All other components within normal limits  HEMOGLOBIN A1C - Abnormal; Notable for the following components:   Hgb A1c MFr Bld 6.0 (*)    All other components within normal limits  SURGICAL PCR SCREEN  COMPREHENSIVE METABOLIC PANEL  GLUCOSE, CAPILLARY  TYPE AND SCREEN     IMAGES:   EKG:   CV: Myocardial Perfusion 01/28/2023    The study is normal. The study is low risk.   No ST deviation was noted.   Left ventricular function is normal. Nuclear stress EF: 64 %. The left ventricular ejection fraction is normal (55-65%). End diastolic cavity size is normal.   Prior study not available for comparison.   Echo 01/28/2023  1. Left ventricular ejection fraction, by estimation, is 60 to 65%. The  left ventricle has normal function. The left ventricle has no regional  wall motion abnormalities. There is mild left ventricular hypertrophy.  Left ventricular diastolic parameters  are consistent with Grade I diastolic dysfunction (impaired relaxation).   2. Right ventricular systolic function is normal. The right ventricular  size is normal.   3. Left atrial size was severely dilated.   4. The mitral valve is normal in structure. Mild mitral valve  regurgitation. No evidence of mitral stenosis.   5. Tricuspid valve regurgitation is mild to moderate.   6. The aortic  valve is calcified. There is mild calcification of the  aortic valve. There is mild thickening of the aortic valve. Aortic valve  regurgitation is not visualized. Mild aortic valve stenosis. Aortic valve  area, by VTI measures 1.59 cm.  Aortic valve mean gradient measures 12.7 mmHg.   7. The inferior vena cava is normal in size with greater than 50%  respiratory variability, suggesting right atrial pressure of 3 mmHg.  Past Medical History:  Diagnosis Date   Arthritis    Cancer [facility])    skin-lip-face   Carotid stenosis, bilateral 05/09/2016   Cataract  05/15/2020   Formatting of this note might be different from the original. 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 (HCC) 08/22/2015   Overview:  80% CX - DES oct 2014 80% CX - DES oct 2014   Diabetes mellitus without complication (HCC)    prediabetic   Dysrhythmia    occ   Essential hypertension 08/22/2015   GERD (gastroesophageal reflux disease)    GERD without esophagitis 05/09/2017   H/O heart artery stent 05/26/2017   Hyperlipemia 08/22/2015   Hypertension    Myocardial infarction (HCC)    Screening for colon cancer 05/27/2019   Type 2 diabetes mellitus with hyperglycemia, without long-term current use of insulin (HCC) 05/09/2017    Past Surgical History:  Procedure Laterality Date   BACK SURGERY     CAROTID ENDARTERECTOMY Left    CATARACT EXTRACTION     INGUINAL HERNIA REPAIR     KNEE ARTHROSCOPY Right    REVERSE SHOULDER ARTHROPLASTY Right 03/28/2016   Procedure: REVERSE SHOULDER ARTHROPLASTY;  Surgeon: Cammy Copa, MD;  Location: [facility] OR;  Service: Orthopedics;  Laterality: Right;   SHOULDER ARTHROSCOPY W/ ROTATOR CUFF REPAIR Right    SKIN BIOPSY      MEDICATIONS:  amLODipine (NORVASC) 5 MG tablet   aspirin EC 81 MG tablet   carvedilol (COREG) 25 MG tablet   clopidogrel (PLAVIX) 75 MG tablet   diclofenac (VOLTAREN) 75 MG EC tablet   empagliflozin (JARDIANCE) 10 MG TABS tablet   finasteride (PROSCAR) 5 MG tablet   furosemide (LASIX) 20 MG tablet   Multiple Vitamin (MULTI-VITAMINS) TABS   pantoprazole (PROTONIX) 40 MG tablet   pravastatin (PRAVACHOL) 20 MG tablet   telmisartan (MICARDIS) 80 MG tablet   No current facility-administered medications for this encounter.    chlorhexidine (HIBICLENS) 4 % liquid 4 application   chlorhexidine (HIBICLENS) 4 % liquid 4 application    [facility] Ward, PA-C [facility] Pre-Surgical Testing [phone]

## 2023-03-03 ENCOUNTER — Other Ambulatory Visit: Payer: Self-pay

## 2023-03-03 ENCOUNTER — Ambulatory Visit (HOSPITAL_COMMUNITY): Payer: Medicare Other | Admitting: Physician Assistant

## 2023-03-03 ENCOUNTER — Observation Stay (HOSPITAL_COMMUNITY): Payer: Medicare Other

## 2023-03-03 ENCOUNTER — Observation Stay (HOSPITAL_COMMUNITY)
Admission: RE | Admit: 2023-03-03 | Discharge: 2023-03-04 | Disposition: A | Discharge status: Home Health | Payer: Medicare Other | Source: Ambulatory Visit | Attending: Orthopedic Surgery | Admitting: Orthopedic Surgery

## 2023-03-03 ENCOUNTER — Ambulatory Visit (HOSPITAL_BASED_OUTPATIENT_CLINIC_OR_DEPARTMENT_OTHER): Payer: Medicare Other | Admitting: Certified Registered Nurse Anesthetist

## 2023-03-03 ENCOUNTER — Encounter (HOSPITAL_COMMUNITY): Payer: Self-pay | Admitting: Orthopedic Surgery

## 2023-03-03 ENCOUNTER — Encounter (HOSPITAL_COMMUNITY)
Admission: RE | Disposition: A | Discharge status: Home Health | Payer: Self-pay | Source: Ambulatory Visit | Attending: Orthopedic Surgery

## 2023-03-03 DIAGNOSIS — Z794 Long term (current) use of insulin: Secondary | ICD-10-CM | POA: Diagnosis not present

## 2023-03-03 DIAGNOSIS — Z85828 Personal history of other malignant neoplasm of skin: Secondary | ICD-10-CM | POA: Insufficient documentation

## 2023-03-03 DIAGNOSIS — M1712 Unilateral primary osteoarthritis, left knee: Principal | ICD-10-CM | POA: Diagnosis present

## 2023-03-03 DIAGNOSIS — E119 Type 2 diabetes mellitus without complications: Secondary | ICD-10-CM | POA: Insufficient documentation

## 2023-03-03 DIAGNOSIS — Z87891 Personal history of nicotine dependence: Secondary | ICD-10-CM | POA: Diagnosis not present

## 2023-03-03 DIAGNOSIS — I2510 Atherosclerotic heart disease of native coronary artery without angina pectoris: Secondary | ICD-10-CM | POA: Diagnosis not present

## 2023-03-03 DIAGNOSIS — Z96611 Presence of right artificial shoulder joint: Secondary | ICD-10-CM | POA: Diagnosis not present

## 2023-03-03 DIAGNOSIS — I10 Essential (primary) hypertension: Secondary | ICD-10-CM

## 2023-03-03 DIAGNOSIS — Z7982 Long term (current) use of aspirin: Secondary | ICD-10-CM | POA: Insufficient documentation

## 2023-03-03 DIAGNOSIS — G8929 Other chronic pain: Secondary | ICD-10-CM

## 2023-03-03 HISTORY — DX: Unilateral primary osteoarthritis, left knee: M17.12

## 2023-03-03 HISTORY — PX: TOTAL KNEE ARTHROPLASTY: SHX125

## 2023-03-03 LAB — GLUCOSE, CAPILLARY
Glucose-Capillary: 104 mg/dL — ABNORMAL HIGH (ref 70–99)
Glucose-Capillary: 92 mg/dL (ref 70–99)
Glucose-Capillary: 124 mg/dL — ABNORMAL HIGH (ref 70–99)
Glucose-Capillary: 139 mg/dL — ABNORMAL HIGH (ref 70–99)

## 2023-03-03 SURGERY — ARTHROPLASTY, KNEE, TOTAL
Anesthesia: Spinal | Site: Knee | Laterality: Left

## 2023-03-03 MED ORDER — DEXAMETHASONE SODIUM PHOSPHATE 10 MG/ML IJ SOLN: 8.0000 mg | Freq: Once | INTRAMUSCULAR | Status: DC

## 2023-03-03 MED ORDER — OXYCODONE HCL 5 MG PO TABS: 5.0000 mg | ORAL_TABLET | ORAL | 0 refills | Status: AC | PRN

## 2023-03-03 MED ORDER — DOCUSATE SODIUM 100 MG PO CAPS
100.0000 mg | ORAL_CAPSULE | Freq: Two times a day (BID) | ORAL | Status: DC
  Administered 2023-03-03 – 2023-03-04 (×2): 100 mg via ORAL
  Filled 2023-03-03 (×2): qty 1

## 2023-03-03 MED ORDER — FUROSEMIDE 20 MG PO TABS: 20.0000 mg | ORAL_TABLET | Freq: Every day | ORAL | Status: DC | PRN

## 2023-03-03 MED ORDER — METHOCARBAMOL 500 MG PO TABS: 500.0000 mg | ORAL_TABLET | Freq: Four times a day (QID) | ORAL | Status: DC | PRN

## 2023-03-03 MED ORDER — AMLODIPINE BESYLATE 5 MG PO TABS
5.0000 mg | ORAL_TABLET | Freq: Every day | ORAL | Status: DC
  Administered 2023-03-04: 5 mg via ORAL
  Filled 2023-03-03: qty 1

## 2023-03-03 MED ORDER — ACETAMINOPHEN 500 MG PO TABS
1000.0000 mg | ORAL_TABLET | Freq: Four times a day (QID) | ORAL | Status: DC
  Administered 2023-03-03 – 2023-03-04 (×3): 1000 mg via ORAL
  Filled 2023-03-03 (×3): qty 2

## 2023-03-03 MED ORDER — AMISULPRIDE (ANTIEMETIC) 5 MG/2ML IV SOLN: 10.0000 mg | Freq: Once | INTRAVENOUS | Status: DC | PRN

## 2023-03-03 MED ORDER — ACETAMINOPHEN 500 MG PO TABS
1000.0000 mg | ORAL_TABLET | Freq: Once | ORAL | Status: AC
  Administered 2023-03-03: 1000 mg via ORAL
  Filled 2023-03-03: qty 2

## 2023-03-03 MED ORDER — POVIDONE-IODINE 10 % EX SWAB: 2.0000 | Freq: Once | CUTANEOUS | Status: DC

## 2023-03-03 MED ORDER — ACETAMINOPHEN 500 MG PO TABS: 1000.0000 mg | ORAL_TABLET | Freq: Three times a day (TID) | ORAL | 0 refills | Status: AC | PRN

## 2023-03-03 MED ORDER — LACTATED RINGERS IV SOLN: INTRAVENOUS | Status: DC

## 2023-03-03 MED ORDER — MENTHOL 3 MG MT LOZG: 1.0000 | LOZENGE | OROMUCOSAL | Status: DC | PRN

## 2023-03-03 MED ORDER — WATER FOR IRRIGATION, STERILE IR SOLN
Status: DC | PRN
  Administered 2023-03-03: 2000 mL

## 2023-03-03 MED ORDER — ORAL CARE MOUTH RINSE: 15.0000 mL | Freq: Once | OROMUCOSAL | Status: AC

## 2023-03-03 MED ORDER — ONDANSETRON HCL 4 MG/2ML IJ SOLN: 4.0000 mg | Freq: Four times a day (QID) | INTRAMUSCULAR | Status: DC | PRN

## 2023-03-03 MED ORDER — ASPIRIN 81 MG PO TBEC: 81.0000 mg | DELAYED_RELEASE_TABLET | Freq: Two times a day (BID) | ORAL | 0 refills | Status: AC

## 2023-03-03 MED ORDER — SODIUM CHLORIDE 0.9 % IR SOLN
Status: DC | PRN
  Administered 2023-03-03: 3000 mL

## 2023-03-03 MED ORDER — DICLOFENAC SODIUM 75 MG PO TBEC: 75.0000 mg | DELAYED_RELEASE_TABLET | Freq: Two times a day (BID) | ORAL | 0 refills | Status: AC

## 2023-03-03 MED ORDER — INSULIN ASPART 100 UNIT/ML IJ SOLN: 0.0000 [IU] | Freq: Three times a day (TID) | INTRAMUSCULAR | Status: DC

## 2023-03-03 MED ORDER — ACETAMINOPHEN 325 MG PO TABS: 325.0000 mg | ORAL_TABLET | Freq: Four times a day (QID) | ORAL | Status: DC | PRN

## 2023-03-03 MED ORDER — ASPIRIN 81 MG PO CHEW
81.0000 mg | CHEWABLE_TABLET | Freq: Two times a day (BID) | ORAL | Status: DC
  Administered 2023-03-03 – 2023-03-04 (×2): 81 mg via ORAL
  Filled 2023-03-03 (×2): qty 1

## 2023-03-03 MED ORDER — METHOCARBAMOL 500 MG IVPB - SIMPLE MED: 500.0000 mg | Freq: Four times a day (QID) | INTRAVENOUS | Status: DC | PRN

## 2023-03-03 MED ORDER — TRANEXAMIC ACID-NACL 1000-0.7 MG/100ML-% IV SOLN
1000.0000 mg | INTRAVENOUS | Status: AC
  Administered 2023-03-03: 1000 mg via INTRAVENOUS
  Filled 2023-03-03: qty 100

## 2023-03-03 MED ORDER — CHLORHEXIDINE GLUCONATE 0.12 % MT SOLN
15.0000 mL | Freq: Once | OROMUCOSAL | Status: AC
  Administered 2023-03-03: 15 mL via OROMUCOSAL

## 2023-03-03 MED ORDER — BUPIVACAINE LIPOSOME 1.3 % IJ SUSP
INTRAMUSCULAR | Status: AC
  Filled 2023-03-03: qty 20

## 2023-03-03 MED ORDER — PROPOFOL 500 MG/50ML IV EMUL
INTRAVENOUS | Status: DC | PRN
  Administered 2023-03-03: 75 ug/kg/min via INTRAVENOUS
  Administered 2023-03-03: 30 mg via INTRAVENOUS

## 2023-03-03 MED ORDER — 0.9 % SODIUM CHLORIDE (POUR BTL) OPTIME
TOPICAL | Status: DC | PRN
  Administered 2023-03-03: 1000 mL

## 2023-03-03 MED ORDER — BUPIVACAINE IN DEXTROSE 0.75-8.25 % IT SOLN
INTRATHECAL | Status: DC | PRN
  Administered 2023-03-03: 1.6 mL via INTRATHECAL

## 2023-03-03 MED ORDER — CLONIDINE HCL (ANALGESIA) 100 MCG/ML EP SOLN
EPIDURAL | Status: DC | PRN
  Administered 2023-03-03: 50 ug

## 2023-03-03 MED ORDER — POLYETHYLENE GLYCOL 3350 17 G PO PACK: 17.0000 g | PACK | Freq: Every day | ORAL | 0 refills | Status: AC

## 2023-03-03 MED ORDER — CEFAZOLIN SODIUM-DEXTROSE 2-4 GM/100ML-% IV SOLN
2.0000 g | Freq: Four times a day (QID) | INTRAVENOUS | Status: AC
  Administered 2023-03-03 (×2): 2 g via INTRAVENOUS
  Filled 2023-03-03: qty 100

## 2023-03-03 MED ORDER — DIPHENHYDRAMINE HCL 12.5 MG/5ML PO ELIX: 12.5000 mg | ORAL_SOLUTION | ORAL | Status: DC | PRN

## 2023-03-03 MED ORDER — EPINEPHRINE PF 1 MG/ML IJ SOLN
INTRAMUSCULAR | Status: AC
  Filled 2023-03-03: qty 1

## 2023-03-03 MED ORDER — FINASTERIDE 5 MG PO TABS
5.0000 mg | ORAL_TABLET | Freq: Every day | ORAL | Status: DC
  Administered 2023-03-04: 5 mg via ORAL
  Filled 2023-03-03: qty 1

## 2023-03-03 MED ORDER — BUPIVACAINE LIPOSOME 1.3 % IJ SUSP: 20.0000 mL | Freq: Once | INTRAMUSCULAR | Status: DC

## 2023-03-03 MED ORDER — MIDAZOLAM HCL 2 MG/2ML IJ SOLN
2.0000 mg | INTRAMUSCULAR | Status: DC
  Filled 2023-03-03: qty 2

## 2023-03-03 MED ORDER — DEXAMETHASONE SODIUM PHOSPHATE 10 MG/ML IJ SOLN
INTRAMUSCULAR | Status: DC | PRN
  Administered 2023-03-03: 10 mg via INTRAVENOUS

## 2023-03-03 MED ORDER — ONDANSETRON HCL 4 MG PO TABS: 4.0000 mg | ORAL_TABLET | Freq: Four times a day (QID) | ORAL | Status: DC | PRN

## 2023-03-03 MED ORDER — BUPIVACAINE LIPOSOME 1.3 % IJ SUSP
INTRAMUSCULAR | Status: DC | PRN
  Administered 2023-03-03: 20 mL

## 2023-03-03 MED ORDER — POLYETHYLENE GLYCOL 3350 17 G PO PACK: 17.0000 g | PACK | Freq: Every day | ORAL | Status: DC | PRN

## 2023-03-03 MED ORDER — PHENOL 1.4 % MT LIQD: 1.0000 | OROMUCOSAL | Status: DC | PRN

## 2023-03-03 MED ORDER — KETOROLAC TROMETHAMINE 15 MG/ML IJ SOLN
7.5000 mg | Freq: Four times a day (QID) | INTRAMUSCULAR | Status: DC
  Administered 2023-03-03 – 2023-03-04 (×3): 7.5 mg via INTRAVENOUS
  Filled 2023-03-03 (×3): qty 1

## 2023-03-03 MED ORDER — BUPIVACAINE-EPINEPHRINE 0.25% -1:200000 IJ SOLN
INTRAMUSCULAR | Status: DC | PRN
  Administered 2023-03-03: 30 mL

## 2023-03-03 MED ORDER — FENTANYL CITRATE PF 50 MCG/ML IJ SOSY: 25.0000 ug | PREFILLED_SYRINGE | INTRAMUSCULAR | Status: DC | PRN

## 2023-03-03 MED ORDER — HYDROMORPHONE HCL 1 MG/ML IJ SOLN: 0.5000 mg | INTRAMUSCULAR | Status: DC | PRN

## 2023-03-03 MED ORDER — CEFAZOLIN SODIUM-DEXTROSE 2-4 GM/100ML-% IV SOLN
2.0000 g | INTRAVENOUS | Status: AC
  Administered 2023-03-03: 2 g via INTRAVENOUS
  Filled 2023-03-03: qty 100

## 2023-03-03 MED ORDER — SODIUM CHLORIDE 0.9 % IV SOLN: INTRAVENOUS | Status: DC

## 2023-03-03 MED ORDER — ONDANSETRON HCL 4 MG/2ML IJ SOLN
INTRAMUSCULAR | Status: DC | PRN
  Administered 2023-03-03: 4 mg via INTRAVENOUS

## 2023-03-03 MED ORDER — EMPAGLIFLOZIN 10 MG PO TABS
10.0000 mg | ORAL_TABLET | Freq: Every day | ORAL | Status: DC
  Filled 2023-03-03: qty 1

## 2023-03-03 MED ORDER — CARVEDILOL 25 MG PO TABS
25.0000 mg | ORAL_TABLET | Freq: Two times a day (BID) | ORAL | Status: DC
  Administered 2023-03-03 – 2023-03-04 (×2): 25 mg via ORAL
  Filled 2023-03-03 (×2): qty 1

## 2023-03-03 MED ORDER — SODIUM CHLORIDE 0.9% FLUSH
INTRAVENOUS | Status: DC | PRN
  Administered 2023-03-03: 30 mL

## 2023-03-03 MED ORDER — PANTOPRAZOLE SODIUM 40 MG PO TBEC
40.0000 mg | DELAYED_RELEASE_TABLET | Freq: Every day | ORAL | Status: DC
  Administered 2023-03-04: 40 mg via ORAL
  Filled 2023-03-03: qty 1

## 2023-03-03 MED ORDER — FENTANYL CITRATE PF 50 MCG/ML IJ SOSY
100.0000 ug | PREFILLED_SYRINGE | INTRAMUSCULAR | Status: DC
  Administered 2023-03-03: 50 ug via INTRAVENOUS
  Filled 2023-03-03: qty 2

## 2023-03-03 MED ORDER — METHOCARBAMOL 500 MG PO TABS: 500.0000 mg | ORAL_TABLET | Freq: Three times a day (TID) | ORAL | 0 refills | Status: AC | PRN

## 2023-03-03 MED ORDER — SODIUM CHLORIDE (PF) 0.9 % IJ SOLN
INTRAMUSCULAR | Status: AC
  Filled 2023-03-03: qty 50

## 2023-03-03 MED ORDER — PRAVASTATIN SODIUM 20 MG PO TABS
20.0000 mg | ORAL_TABLET | Freq: Every evening | ORAL | Status: DC
  Administered 2023-03-03: 20 mg via ORAL
  Filled 2023-03-03: qty 1

## 2023-03-03 MED ORDER — IRBESARTAN 150 MG PO TABS
300.0000 mg | ORAL_TABLET | Freq: Every day | ORAL | Status: DC
  Administered 2023-03-04: 300 mg via ORAL
  Filled 2023-03-03: qty 2

## 2023-03-03 MED ORDER — ISOPROPYL ALCOHOL 70 % SOLN
Status: DC | PRN
  Administered 2023-03-03: 1 via TOPICAL

## 2023-03-03 MED ORDER — BUPIVACAINE HCL (PF) 0.25 % IJ SOLN
INTRAMUSCULAR | Status: AC
  Filled 2023-03-03: qty 30

## 2023-03-03 MED ORDER — OXYCODONE HCL 5 MG PO TABS: 5.0000 mg | ORAL_TABLET | ORAL | Status: DC | PRN

## 2023-03-03 MED ORDER — ROPIVACAINE HCL 5 MG/ML IJ SOLN
INTRAMUSCULAR | Status: DC | PRN
  Administered 2023-03-03: 20 mL via PERINEURAL

## 2023-03-03 SURGICAL SUPPLY — 66 items
ADH SKN CLS APL DERMABOND .7 (GAUZE/BANDAGES/DRESSINGS) ×2
APL PRP STRL LF DISP 70% ISPRP (MISCELLANEOUS) ×2
BAG COUNTER SPONGE SURGICOUNT (BAG) IMPLANT
BAG SPNG CNTER NS LX DISP (BAG)
BLADE SAG 18X100X1.27 (BLADE) ×1 IMPLANT
BLADE SAW SAG 35X64 .89 (BLADE) ×1 IMPLANT
BNDG CMPR 5X3 CHSV STRCH STRL (GAUZE/BANDAGES/DRESSINGS) ×1
BNDG CMPR MED 10X6 ELC LF (GAUZE/BANDAGES/DRESSINGS) ×1
BNDG COHESIVE 3X5 TAN ST LF (GAUZE/BANDAGES/DRESSINGS) ×1 IMPLANT
BNDG ELASTIC 6X10 VLCR STRL LF (GAUZE/BANDAGES/DRESSINGS) ×1 IMPLANT
BOWL SMART MIX CTS (DISPOSABLE) ×1 IMPLANT
BSPLAT TIB 5D E CMNT STM LT (Knees) ×1 IMPLANT
CEMENT BONE R 1X40 (Cement) IMPLANT
CEMENT BONE REFOBACIN R1X40 US (Cement) IMPLANT
CHLORAPREP W/TINT 26 (MISCELLANEOUS) ×2 IMPLANT
CLSR STERI-STRIP ANTIMIC 1/2X4 (GAUZE/BANDAGES/DRESSINGS) IMPLANT
COVER SURGICAL LIGHT HANDLE (MISCELLANEOUS) ×1 IMPLANT
CUFF TOURN SGL QUICK 34 (TOURNIQUET CUFF) ×1
CUFF TRNQT CYL 34X4.125X (TOURNIQUET CUFF) ×1 IMPLANT
DERMABOND ADVANCED .7 DNX12 (GAUZE/BANDAGES/DRESSINGS) ×1 IMPLANT
DRAPE INCISE IOBAN 85X60 (DRAPES) ×1 IMPLANT
DRAPE SHEET LG 3/4 BI-LAMINATE (DRAPES) ×1 IMPLANT
DRAPE U-SHAPE 47X51 STRL (DRAPES) ×1 IMPLANT
DRESSING AQUACEL AG SP 3.5X10 (GAUZE/BANDAGES/DRESSINGS) ×1 IMPLANT
DRSG AQUACEL AG ADV 3.5X10 (GAUZE/BANDAGES/DRESSINGS) IMPLANT
DRSG AQUACEL AG SP 3.5X10 (GAUZE/BANDAGES/DRESSINGS) ×1
ELECT REM PT RETURN 15FT ADLT (MISCELLANEOUS) ×1 IMPLANT
FEMORAL KNEE COMP SZ 8 STND LT (Knees) ×1 IMPLANT
FEMORAL KNEE COMP SZ 8STD LT (Knees) IMPLANT
GAUZE SPONGE 4X4 12PLY STRL (GAUZE/BANDAGES/DRESSINGS) ×1 IMPLANT
GLOVE BIO SURGEON STRL SZ 6.5 (GLOVE) ×2 IMPLANT
GLOVE BIOGEL PI IND STRL 6.5 (GLOVE) ×1 IMPLANT
GLOVE BIOGEL PI IND STRL 8 (GLOVE) ×1 IMPLANT
GLOVE SURG ORTHO 8.0 STRL STRW (GLOVE) ×2 IMPLANT
GOWN STRL REUS W/ TWL XL LVL3 (GOWN DISPOSABLE) ×2 IMPLANT
GOWN STRL REUS W/TWL XL LVL3 (GOWN DISPOSABLE) ×2
HANDPIECE INTERPULSE COAX TIP (DISPOSABLE) ×1
HOLDER FOLEY CATH W/STRAP (MISCELLANEOUS) ×1 IMPLANT
HOOD PEEL AWAY T7 (MISCELLANEOUS) ×3 IMPLANT
INSERT MED AS PERS SZ 8-11 LT (Insert) IMPLANT
KIT TURNOVER KIT A (KITS) IMPLANT
MANIFOLD NEPTUNE II (INSTRUMENTS) ×1 IMPLANT
MARKER SKIN DUAL TIP RULER LAB (MISCELLANEOUS) ×1 IMPLANT
NS IRRIG 1000ML POUR BTL (IV SOLUTION) ×1 IMPLANT
PACK TOTAL KNEE CUSTOM (KITS) ×1 IMPLANT
PIN DRILL HDLS TROCAR 75 4PK (PIN) IMPLANT
SCREW HEADED 33MM KNEE (MISCELLANEOUS) IMPLANT
SET HNDPC FAN SPRY TIP SCT (DISPOSABLE) ×1 IMPLANT
SOLUTION IRRIG SURGIPHOR (IV SOLUTION) IMPLANT
SPIKE FLUID TRANSFER (MISCELLANEOUS) ×1 IMPLANT
STEM POLY PAT PLY 32M KNEE (Knees) IMPLANT
STEM TIBIA 5 DEG SZ E L KNEE (Knees) IMPLANT
STRIP CLOSURE SKIN 1/2X4 (GAUZE/BANDAGES/DRESSINGS) ×1 IMPLANT
SUT MNCRL AB 3-0 PS2 18 (SUTURE) ×1 IMPLANT
SUT STRATAFIX 0 PDS 27 VIOLET (SUTURE) ×1
SUT STRATAFIX PDO 1 14 VIOLET (SUTURE) ×1
SUT STRATFX PDO 1 14 VIOLET (SUTURE) ×1
SUT VIC AB 2-0 CT2 27 (SUTURE) ×2 IMPLANT
SUTURE STRATFX 0 PDS 27 VIOLET (SUTURE) ×1 IMPLANT
SUTURE STRATFX PDO 1 14 VIOLET (SUTURE) ×1 IMPLANT
SYR 50ML LL SCALE MARK (SYRINGE) ×1 IMPLANT
TIBIA STEM 5 DEG SZ E L KNEE (Knees) ×1 IMPLANT
TRAY FOLEY MTR SLVR 14FR STAT (SET/KITS/TRAYS/PACK) IMPLANT
TUBE SUCTION HIGH CAP CLEAR NV (SUCTIONS) ×1 IMPLANT
UNDERPAD 30X36 HEAVY ABSORB (UNDERPADS AND DIAPERS) ×1 IMPLANT
WRAP KNEE MAXI GEL POST OP (GAUZE/BANDAGES/DRESSINGS) IMPLANT

## 2023-03-03 NOTE — Progress Notes (Signed)
Orthopedic Tech Progress Note Patient Details:  Benjamin Andrade 23-Jan-1935 [id number]  Dropped off bone foam at bedside with PACU RN. Ortho Devices Type of Ortho Device: Bone foam zero knee Ortho Device/Splint Interventions: Floria Raveling 03/03/2023, 2:28 PM

## 2023-03-03 NOTE — Anesthesia Procedure Notes (Signed)
Spinal  Patient location during procedure: OR Start time: 03/03/2023 12:03 PM End time: 03/03/2023 12:08 PM Reason for block: surgical anesthesia Staffing Performed: anesthesiologist  Anesthesiologist: Marcene Duos, MD Performed by: Marcene Duos, MD Authorized by: Marcene Duos, MD   Preanesthetic Checklist Completed: patient identified, IV checked, site marked, risks and benefits discussed, surgical consent, monitors and equipment checked, pre-op evaluation and timeout performed Spinal Block Patient position: sitting Prep: DuraPrep Patient monitoring: heart rate, cardiac monitor, continuous pulse ox and blood pressure Approach: midline Location: L4-5 Injection technique: single-shot Needle Needle type: Pencan  Needle gauge: 24 G Needle length: 9 cm Assessment Sensory level: T4 Events: CSF return and second provider

## 2023-03-03 NOTE — Op Note (Signed)
DATE OF SURGERY:  03/03/2023 TIME: 1:43 PM  PATIENT NAME:  Benjamin Andrade   AGE: 88 y.o.    PRE-OPERATIVE DIAGNOSIS: End-stage left knee osteoarthritis  POST-OPERATIVE DIAGNOSIS:  Same  PROCEDURE: Left total Knee Arthroplasty  SURGEON:  Lakshya Mcgillicuddy A Shondrika Hoque, MD   ASSISTANT: Darron Doom, RNFA, present and scrubbed throughout the case, critical for assistance with exposure, retraction, instrumentation, and closure.   OPERATIVE IMPLANTS:  Cemented Zimmer persona size 8 left standard femur, E tibial baseplate, 32 mm patella, 11 mm MC poly insert Implant Name Type Inv. Item Serial No. Manufacturer Lot No. LRB No. Used Action  CEMENT BONE REFOBACIN R1X40 [place] - [id number] Cement CEMENT BONE REFOBACIN R1X40 [place]  ZIMMER RECON(ORTH,TRAU,BIO,SG) [id number] Left 1 Implanted  CEMENT BONE REFOBACIN R1X40 [place] - [id number] Cement CEMENT BONE REFOBACIN R1X40 [place]  ZIMMER RECON(ORTH,TRAU,BIO,SG) [id number] Left 1 Implanted  STEM POLY PAT PLY 22M KNEE - [id number] Knees STEM POLY PAT PLY 22M KNEE  ZIMMER RECON(ORTH,TRAU,BIO,SG) [id number] Left 1 Implanted  FEMORAL KNEE COMP SZ 8 STND LT - [id number] Knees FEMORAL KNEE COMP SZ 8 STND LT  ZIMMER RECON(ORTH,TRAU,BIO,SG) [id number] Left 1 Implanted  TIBIA STEM 5 DEG SZ E L KNEE - [id number] Knees TIBIA STEM 5 DEG SZ E L KNEE  ZIMMER RECON(ORTH,TRAU,BIO,SG) [id number] Left 1 Implanted  INSERT MED AS PERS SZ 8-11 LT - [id number] Insert INSERT MED AS PERS SZ 8-11 LT  ZIMMER RECON(ORTH,TRAU,BIO,SG) [id number] Left 1 Implanted      PREOPERATIVE INDICATIONS:  Benjamin Andrade is a 88 y.o. year old male with end stage bone on bone degenerative arthritis of the knee who failed conservative treatment, including injections, antiinflammatories, activity modification, and assistive devices, and had significant impairment of their activities of daily living, and elected for Total Knee Arthroplasty.   The risks, benefits, and alternatives were discussed at length including but not  limited to the risks of infection, bleeding, nerve injury, stiffness, blood clots, the need for revision surgery, cardiopulmonary complications, among others, and they were willing to proceed.  ESTIMATED BLOOD LOSS: 50cc  OPERATIVE DESCRIPTION:   Once adequate anesthesia was induced, preoperative antibiotics, 2 gm of ancef,1 gm of Tranexamic Acid, and 8 mg of Decadron administered, the patient was positioned supine with a left thigh tourniquet placed.  The left lower extremity was prepped and draped in sterile fashion.  A time-  out was performed identifying the patient, planned procedure, and the appropriate extremity.     The leg was  exsanguinated, tourniquet elevated to 250 mmHg.  A midline incision was  made followed by median parapatellar arthrotomy. Anterior horn of the medial meniscus was released and resected. A medial release was performed, the infrapatellar fat pad was resected with care taken to protect the patellar tendon. The suprapatellar fat was removed to exposed the distal anterior femur. The anterior horn of the lateral meniscus and ACL were released.    Following initial  exposure, I first started with the femur  The femoral  canal was opened with a drill, canal was suctioned to try to prevent fat emboli.  An  intramedullary rod was passed set at 5 degrees valgus, 10 mm. The distal femur was resected.  Following this resection, the tibia was  subluxated anteriorly.  Using the extramedullary guide, 10mm of bone was resected off   the proximal lateral tibia.  Both the femur and tibia side appears slightly undercut.  Resected additional 2 mm from both the distal femur and the proximal tibia.  Much better rectangular  extension gap with 10mm spacer block. we confirmed the gap would be  stable medially and laterally with a size 10mm spacer block as well as confirmed that the tibial cut was perpendicular in the coronal plane, checking with an alignment rod.    Once this was done, the  posterior femoral referencing femoral sizer was placed under to the posterior condyles with 3 degrees of external rotational which was parallel to the transepicondylar axis and perpendicular to [employer]. The femur was sized to be a size 8 in the anterior-  posterior dimension. The  anterior, posterior, and  chamfer cuts were made without difficulty nor   notching making certain that I was along the anterior cortex to help  with flexion gap stability. Next a laminar spreader was placed with the knee in flexion and the medial lateral menisci were resected.  5 cc of the Exparel mixture was injected in the medial side of the back of the knee and 3 cc in the lateral side.  1/2 inch curved osteotome was used to resect posterior osteophyte that was then removed with a pituitary rongeur.       At this point, the tibia was sized to be a size E.  The size E tray was  then pinned in position. Trial reduction was now carried with a 8 femur, E tibia, a 10 mm MC insert.  Mild laxity medially in extension and improved with 11 mm spacer block. the knee had full extension and was stable to varus valgus stress in extension.  The knee was slightly tight in flexion and the PCL was partially released.   Attention was next directed to the patella.  Precut  measurement was noted to be 25 mm.  I resected down to 15 mm and used a  32mm patellar button to restore patellar height as well as cover the cut surface.     The patella lug holes were drilled and a 32mm patella poly trial was placed.    The knee was brought to full extension with good flexion stability with the patella tracking through the trochlea without application of pressure.     Next the femoral component was again assessed and determined to be seated and appropriately lateralized.  The femoral lug holes were drilled.  The femoral component was then removed. Tibial component was again assessed and felt to be seated and appropriately rotated with the medial  third of the tubercle. The tibia was then drilled, and keel punched.     Final components were  opened and antibiotic cement was mixed.      Final implants were then  cemented onto cleaned and dried cut surfaces of bone with the knee brought to extension with a 11 mm MC poly.  The knee was irrigated with sterile Betadine diluted in saline as well as pulse lavage normal saline. The synovial lining was  then injected a dilute Exparel with 30cc of 0.25% marcaine with epinephrine.         Once the cement had fully cured, excess cement was removed throughout the knee.  I confirmed that I was satisfied with the range of motion and stability, and the final 11 mm MC poly insert was chosen.  It was placed into the knee.         The tourniquet had been let down at 63 minutes.  No significant hemostasis was required.  The medial parapatellar arthrotomy was then reapproximated using #1 Stratafix sutures with the knee  in flexion.  The remaining wound was closed with 0 stratafix, 2-0 Vicryl, and running 3-0 Monocryl. The knee was cleaned, dried, dressed sterilely using Dermabond and   Aquacel dressing.  The patient was then brought to recovery room in stable condition, tolerating the procedure  well. There were no complications.   Post op recs: WB: WBAT Abx: ancef Imaging: PACU xrays DVT prophylaxis: Aspirin 81mg  BID x4 weeks, resume Plavix postop day 2 Follow up: 2 weeks after surgery for a wound check with Dr. Blanchie Dessert at [facility].  Address: [address], [place], [place] [address]  Office Phone: [phone]  Weber Cooks, MD Orthopaedic Surgery

## 2023-03-03 NOTE — Transfer of Care (Signed)
Immediate Anesthesia Transfer of Care Note  Patient: Benjamin Andrade  Procedure(s) Performed: Procedure(s): TOTAL KNEE ARTHROPLASTY (Left)  Patient Location: PACU  Anesthesia Type:Spinal  Level of Consciousness: awake, alert  and oriented  Airway & Oxygen Therapy: Patient Spontanous Breathing  Post-op Assessment: Report given to RN and Post -op Vital signs reviewed and stable  Post vital signs: Reviewed and stable  Last Vitals:  Vitals:   03/03/23 1140 03/03/23 1416  BP: (!) 152/81 113/65  Pulse: (!) 52 (!) 49  Resp: 11 (!) 8  Temp:    SpO2: 93% 96%    Complications: No apparent anesthesia complications

## 2023-03-03 NOTE — Anesthesia Procedure Notes (Signed)
Anesthesia Regional Block: Adductor canal block   Pre-Anesthetic Checklist: , timeout performed,  Correct Patient, Correct Site, Correct Laterality,  Correct Procedure, Correct Position, site marked,  Risks and benefits discussed,  Surgical consent,  Pre-op evaluation,  At surgeon's request and post-op pain management  Laterality: Left  Prep: chloraprep       Needles:  Injection technique: Single-shot  Needle Type: Echogenic Needle     Needle Length: 9cm  Needle Gauge: 21     Additional Needles:   Procedures:,,,, ultrasound used (permanent image in chart),,    Narrative:  Start time: 03/03/2023 11:30 AM End time: 03/03/2023 11:35 AM Injection made incrementally with aspirations every 5 mL.  Performed by: Personally  Anesthesiologist: Marcene Duos, MD

## 2023-03-03 NOTE — Interval H&P Note (Signed)

## 2023-03-03 NOTE — Discharge Instructions (Signed)

## 2023-03-04 ENCOUNTER — Encounter (HOSPITAL_COMMUNITY): Payer: Self-pay | Admitting: Orthopedic Surgery

## 2023-03-04 DIAGNOSIS — M1712 Unilateral primary osteoarthritis, left knee: Secondary | ICD-10-CM | POA: Diagnosis not present

## 2023-03-04 LAB — CBC
HCT: 43.4 % (ref 39.0–52.0)
Hemoglobin: 14.3 g/dL (ref 13.0–17.0)
MCH: 31.9 pg (ref 26.0–34.0)
MCHC: 32.9 g/dL (ref 30.0–36.0)
MCV: 96.9 fL (ref 80.0–100.0)
Platelets: 227 10*3/uL (ref 150–400)
RBC: 4.48 MIL/uL (ref 4.22–5.81)
RDW: 12.6 % (ref 11.5–15.5)
WBC: 12.1 10*3/uL — ABNORMAL HIGH (ref 4.0–10.5)
nRBC: 0 % (ref 0.0–0.2)

## 2023-03-04 LAB — BASIC METABOLIC PANEL
Anion gap: 7 (ref 5–15)
BUN: 15 mg/dL (ref 8–23)
CO2: 24 mmol/L (ref 22–32)
Calcium: 8.5 mg/dL — ABNORMAL LOW (ref 8.9–10.3)
Chloride: 104 mmol/L (ref 98–111)
Creatinine, Ser: 0.8 mg/dL (ref 0.61–1.24)
GFR, Estimated: >60 mL/min (ref >60)
Glucose, Bld: 143 mg/dL — ABNORMAL HIGH (ref 70–99)
Potassium: 3.8 mmol/L (ref 3.5–5.1)
Sodium: 135 mmol/L (ref 135–145)

## 2023-03-04 LAB — GLUCOSE, CAPILLARY
Glucose-Capillary: 126 mg/dL — ABNORMAL HIGH (ref 70–99)
Glucose-Capillary: 89 mg/dL (ref 70–99)

## 2023-03-04 NOTE — Evaluation (Signed)
Physical Therapy Evaluation Patient Details Name: Benjamin Andrade MRN: [id number] DOB: 15-Jul-1935 Today's Date: 03/04/2023  History of Present Illness  88 yo male s/p L TKA 03/03/23. Hx of R rev TSA 2017  Clinical Impression  On eval, pt was Min guard A for mobility. He walked ~150 feet with a RW. Pt reported that pain was controlled. Reviewed/practiced exercises and gait training. Issued HEP for pt to follow until HHPT begins. Encouraged him to ambulate often as tolerated. All PT education completed.        Recommendations for follow up therapy are one component of a multi-disciplinary discharge planning process, led by the attending physician.  Recommendations may be updated based on patient status, additional functional criteria and insurance authorization.  Follow Up Recommendations       Assistance Recommended at Discharge Intermittent Supervision/Assistance  Patient can return home with the following  Assist for transportation;Assistance with cooking/housework;Help with stairs or ramp for entrance    Equipment Recommendations Rolling walker (2 wheels)  Recommendations for Other Services       Functional Status Assessment Patient has had a recent decline in their functional status and demonstrates the ability to make significant improvements in function in a reasonable and predictable amount of time.     Precautions / Restrictions Precautions Precautions: Knee Restrictions Weight Bearing Restrictions: No LLE Weight Bearing: Weight bearing as tolerated      Mobility  Bed Mobility               General bed mobility comments: oob in recliner    Transfers Overall transfer level: Needs assistance Equipment used: Rolling walker (2 wheels) Transfers: Sit to/from Stand Sit to Stand: Supervision           General transfer comment: Supv for safety, hand placement.    Ambulation/Gait Ambulation/Gait assistance: Min guard Gait Distance (Feet): 150 Feet Assistive  device: Rolling walker (2 wheels) Gait Pattern/deviations: Step-through pattern, Decreased stride length       General Gait Details: Min guard A for safety. Pt denied dizziness. Tolerated distance well.  Stairs            Wheelchair Mobility    Modified Rankin (Stroke Patients Only)       Balance Overall balance assessment: Needs assistance         Standing balance support: Reliant on assistive device for balance, Bilateral upper extremity supported, During functional activity Standing balance-Leahy Scale: Fair                               Pertinent Vitals/Pain Pain Assessment Pain Assessment: 0-10 Pain Score: 5  Pain Location: L knee with activity Pain Descriptors / Indicators: Sore, Discomfort Pain Intervention(s): Monitored during session, Ice applied, Repositioned    Home Living Family/patient expects to be discharged to:: Private residence Living Arrangements: Spouse/significant other Available Help at Discharge: Family Type of Home: House Home Access: Ramped entrance       Home Layout: One level Home Equipment: Rollator (4 wheels)      Prior Function Prior Level of Function : Independent/Modified Independent                     Hand Dominance        Extremity/Trunk Assessment   Upper Extremity Assessment Upper Extremity Assessment: Overall WFL for tasks assessed    Lower Extremity Assessment Lower Extremity Assessment: Generalized weakness    Cervical / Trunk Assessment Cervical /  Trunk Assessment: Normal  Communication   Communication: No difficulties  Cognition Arousal/Alertness: Awake/alert Behavior During Therapy: WFL for tasks assessed/performed Overall Cognitive Status: Within Functional Limits for tasks assessed                                          General Comments      Exercises Total Joint Exercises Ankle Circles/Pumps: AROM, Both, 10 reps Quad Sets: AROM, Left, 10 reps Heel  Slides: AROM, Left, 10 reps Hip ABduction/ADduction: AROM, Left, 10 reps Straight Leg Raises: AROM, Left, 10 reps   Assessment/Plan    PT Assessment Patient needs continued PT services  PT Problem List Decreased strength;Decreased range of motion;Decreased activity tolerance;Decreased balance;Decreased mobility;Decreased knowledge of use of DME       PT Treatment Interventions DME instruction;Gait training;Therapeutic exercise;Balance training;Stair training;Functional mobility training;Therapeutic activities;Patient/family education    PT Goals (Current goals can be found in the Care Plan section)  Acute Rehab PT Goals Patient Stated Goal: home today. regain PLOF/independence PT Goal Formulation: With patient Time For Goal Achievement: 03/18/23 Potential to Achieve Goals: Good    Frequency 7X/week     Co-evaluation               AM-PAC PT "6 Clicks" Mobility  Outcome Measure Help needed turning from your back to your side while in a flat bed without using bedrails?: A Little Help needed moving from lying on your back to sitting on the side of a flat bed without using bedrails?: A Little Help needed moving to and from a bed to a chair (including a wheelchair)?: A Little Help needed standing up from a chair using your arms (e.g., wheelchair or bedside chair)?: A Little Help needed to walk in hospital room?: A Little Help needed climbing 3-5 steps with a railing? : A Little 6 Click Score: 18    End of Session Equipment Utilized During Treatment: Gait belt Activity Tolerance: Patient tolerated treatment well Patient left: in chair;with call bell/phone within reach   PT Visit Diagnosis: Pain;Other abnormalities of gait and mobility (R26.89) Pain - Right/Left: Left Pain - part of body: Knee    Time: 1610-9604 PT Time Calculation (min) (ACUTE ONLY): 23 min   Charges:   PT Evaluation $PT Eval Low Complexity: 1 Low PT Treatments $Gait Training: 8-22 mins          Faye Ramsay, PT Acute Rehabilitation  Office: [phone]

## 2023-03-04 NOTE — Anesthesia Postprocedure Evaluation (Signed)
Anesthesia Post Note  Patient: Benjamin Andrade  Procedure(s) Performed: TOTAL KNEE ARTHROPLASTY (Left: Knee)     Patient location during evaluation: PACU Anesthesia Type: Spinal Level of consciousness: awake and alert Pain management: pain level controlled Vital Signs Assessment: post-procedure vital signs reviewed and stable Respiratory status: spontaneous breathing and respiratory function stable Cardiovascular status: blood pressure returned to baseline and stable Postop Assessment: spinal receding Anesthetic complications: no   No notable events documented.  Last Vitals:  Vitals:   03/04/23 0015 03/04/23 0505  BP: 126/81 (!) 142/75  Pulse: (!) 59 63  Resp: 17 16  Temp: 36.9 C 36.9 C  SpO2: 94% 95%    Last Pain:  Vitals:   03/04/23 0505  TempSrc: Oral  PainSc:                  [name]

## 2023-03-04 NOTE — Discharge Summary (Signed)
Physician Discharge Summary  Patient ID: Benjamin Andrade MRN: [id number] DOB/AGE: July 11, 1935 88 y.o.  Admit date: 03/03/2023 Discharge date: 03/04/2023  Admission Diagnoses:  Primary osteoarthritis of left knee  Discharge Diagnoses:  Principal Problem:   Primary osteoarthritis of left knee   Past Medical History:  Diagnosis Date   Arthritis    Cancer (HCC)    skin-lip-face   Carotid stenosis, bilateral 05/09/2016   Cataract 05/15/2020   Formatting of this note might be different from the original. 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 (HCC) 08/22/2015   Overview:  80% CX - DES oct 2014 80% CX - DES oct 2014   Diabetes mellitus without complication (HCC)    prediabetic   Dysrhythmia    occ   Essential hypertension 08/22/2015   GERD (gastroesophageal reflux disease)    GERD without esophagitis 05/09/2017   H/O heart artery stent 05/26/2017   Hyperlipemia 08/22/2015   Hypertension    Myocardial infarction (HCC)    Screening for colon cancer 05/27/2019   Type 2 diabetes mellitus with hyperglycemia, without long-term current use of insulin (HCC) 05/09/2017    Surgeries: Procedure(s): TOTAL KNEE ARTHROPLASTY on 03/03/2023   Consultants (if any):   Discharged Condition: Improved  Hospital Course: Benjamin Andrade is an 88 y.o. male who was admitted 03/03/2023 with a diagnosis of Primary osteoarthritis of left knee and went to the operating room on 03/03/2023 and underwent the above named procedures.    He was given perioperative antibiotics:  Anti-infectives (From admission, onward)    Start     Dose/Rate Route Frequency Ordered Stop   03/03/23 1800  ceFAZolin (ANCEF) IVPB 2g/100 mL premix        2 g 200 mL/hr over 30 Minutes Intravenous Every 6 hours 03/03/23 1553 03/04/23 0018   03/03/23 1045  ceFAZolin (ANCEF) IVPB 2g/100 mL premix        2 g 200 mL/hr over 30 Minutes  Intravenous On call to O.R. 03/03/23 1037 03/03/23 1228     .  He was given sequential compression devices, early ambulation, and aspirin for DVT prophylaxis.  He benefited maximally from the hospital stay and there were no complications.    Recent vital signs:  Vitals:   03/04/23 0015 03/04/23 0505  BP: 126/81 (!) 142/75  Pulse: (!) 59 63  Resp: 17 16  Temp: 98.5 F (36.9 C) 98.4 F (36.9 C)  SpO2: 94% 95%    Recent laboratory studies:  Lab Results  Component Value Date   HGB 14.3 03/04/2023   HGB 15.3 02/18/2023   HGB 15.7 01/13/2023   Lab Results  Component Value Date   WBC 12.1 (H) 03/04/2023   PLT 227 03/04/2023   No results found for: "INR" Lab Results  Component Value Date   NA 135 03/04/2023   K 3.8 03/04/2023   CL 104 03/04/2023   CO2 24 03/04/2023   BUN 15 03/04/2023   CREATININE 0.80 03/04/2023   GLUCOSE 143 (H) 03/04/2023    Discharge Medications:   Allergies as of 03/04/2023   No Known Allergies      Medication List     TAKE these medications    acetaminophen 500 MG tablet Commonly known as: TYLENOL Take 2 tablets (1,000 mg total) by mouth every 8 (eight) hours as needed.   amLODipine 5 MG tablet Commonly known as: NORVASC Take 5 mg by mouth  daily.   aspirin EC 81 MG tablet Take 1 tablet (81 mg total) by mouth 2 (two) times daily for 28 days. Swallow whole. What changed:  when to take this additional instructions   carvedilol 25 MG tablet Commonly known as: COREG Take 25 mg by mouth 2 (two) times daily with a meal.   clopidogrel 75 MG tablet Commonly known as: PLAVIX Take 75 mg by mouth every morning.   diclofenac 75 MG EC tablet Commonly known as: VOLTAREN Take 1 tablet (75 mg total) by mouth 2 (two) times daily for 14 days.   empagliflozin 10 MG Tabs tablet Commonly known as: Jardiance Take 1 tablet (10 mg total) by mouth daily before breakfast.   finasteride 5 MG tablet Commonly known as: PROSCAR Take 5 mg by mouth  daily.   furosemide 20 MG tablet Commonly known as: LASIX Takes 20 mg (1 tablets) my mouth twice daily for 3 days then 20 mg (1 tablet) daily as needed for swelling. What changed:  how much to take how to take this when to take this reasons to take this additional instructions   methocarbamol 500 MG tablet Commonly known as: ROBAXIN Take 1 tablet (500 mg total) by mouth every 8 (eight) hours as needed for up to 10 days for muscle spasms.   Multi-Vitamins Tabs Take 1 tablet by mouth every morning.   oxyCODONE 5 MG immediate release tablet Commonly known as: Roxicodone Take 1 tablet (5 mg total) by mouth every 4 (four) hours as needed for up to 7 days for severe pain or moderate pain.   pantoprazole 40 MG tablet Commonly known as: PROTONIX Take 40 mg by mouth every morning.   polyethylene glycol 17 g packet Commonly known as: MiraLax Take 17 g by mouth daily.   pravastatin 20 MG tablet Commonly known as: PRAVACHOL TAKE 1 TABLET BY MOUTH IN THE  EVENING   telmisartan 80 MG tablet Commonly known as: MICARDIS Take 80 mg by mouth at bedtime.        Diagnostic Studies: DG Knee Left Port  Result Date: 03/03/2023 CLINICAL DATA:  Postop left knee. EXAM: PORTABLE LEFT KNEE - 1-2 VIEW COMPARISON:  None Available. FINDINGS: Left knee arthroplasty in expected alignment. No periprosthetic lucency or fracture. There has been patellar resurfacing. Recent postsurgical change includes air and edema in the soft tissues and joint space. IMPRESSION: Left knee arthroplasty without immediate postoperative complication. Electronically Signed   By: Narda Rutherford M.D.   On: 03/03/2023 17:34    Disposition: Discharge disposition: 01-Home or Self Care       Discharge Instructions     Call MD / Call 911   Complete by: As directed    If you experience chest pain or shortness of breath, CALL 911 and be transported to the hospital emergency room.  If you develope a fever above 101 F, pus  (white drainage) or increased drainage or redness at the wound, or calf pain, call your surgeon's office.   Constipation Prevention   Complete by: As directed    Drink plenty of fluids.  Prune juice may be helpful.  You may use a stool softener, such as Colace (over the counter) 100 mg twice a day.  Use MiraLax (over the counter) for constipation as needed.   Diet - low sodium heart healthy   Complete by: As directed    Do not put a pillow under the knee. Place it under the heel.   Complete by: As directed  Driving restrictions   Complete by: As directed    No driving for minimum of 2 weeks   Increase activity slowly as tolerated   Complete by: As directed    Post-operative opioid taper instructions:   Complete by: As directed    POST-OPERATIVE OPIOID TAPER INSTRUCTIONS: It is important to wean off of your opioid medication as soon as possible. If you do not need pain medication after your surgery it is ok to stop day one. Opioids include: Codeine, Hydrocodone(Norco, Vicodin), Oxycodone(Percocet, oxycontin) and hydromorphone amongst others.  Long term and even short term use of opiods can cause: Increased pain response Dependence Constipation Depression Respiratory depression And more.  Withdrawal symptoms can include Flu like symptoms Nausea, vomiting And more Techniques to manage these symptoms Hydrate well Eat regular healthy meals Stay active Use relaxation techniques(deep breathing, meditating, yoga) Do Not substitute Alcohol to help with tapering If you have been on opioids for less than two weeks and do not have pain than it is ok to stop all together.  Plan to wean off of opioids This plan should start within one week post op of your joint replacement. Maintain the same interval or time between taking each dose and first decrease the dose.  Cut the total daily intake of opioids by one tablet each day Next start to increase the time between doses. The last dose that  should be eliminated is the evening dose.      TED hose   Complete by: As directed    Use stockings (TED hose) for 2 weeks on both leg(s).  Then for 2 more weeks on the surgical leg.  You may remove them at night for sleeping.        Follow-up Information     Joen Laura, MD Follow up in 2 week(s).   Specialty: Orthopedic Surgery Contact information: [address] [place] [address] [phone]                    Discharge Instructions      INSTRUCTIONS AFTER JOINT REPLACEMENT   Remove items at home which could result in a fall. This includes throw rugs or furniture in walking pathways ICE to the affected joint every three hours while awake for 30 minutes at a time, for at least the first 3-5 days, and then as needed for pain and swelling.  Continue to use ice for pain and swelling. You may notice swelling that will progress down to the foot and ankle.  This is normal after surgery.  Elevate your leg when you are not up walking on it.   Continue to use the breathing machine you got in the hospital (incentive spirometer) which will help keep your temperature down.  It is common for your temperature to cycle up and down following surgery, especially at night when you are not up moving around and exerting yourself.  The breathing machine keeps your lungs expanded and your temperature down.   DIET:  As you were doing prior to hospitalization, we recommend a well-balanced diet.  DRESSING / WOUND CARE / SHOWERING  Keep the surgical dressing until follow up.  The dressing is water proof, so you can shower without any extra covering.  IF THE DRESSING FALLS OFF or the wound gets wet inside, change the dressing with sterile gauze.  Please use good hand washing techniques before changing the dressing.  Do not use any lotions or creams on the incision until instructed  by your surgeon.    ACTIVITY  Increase activity slowly as tolerated, but follow the  weight bearing instructions below.   No driving for 6 weeks or until further direction given by your physician.  You cannot drive while taking narcotics.  No lifting or carrying greater than 10 lbs. until further directed by your surgeon. Avoid periods of inactivity such as sitting longer than an hour when not asleep. This helps prevent blood clots.  You may return to work once you are authorized by your doctor.     WEIGHT BEARING   Weight bearing as tolerated with assist device (walker, cane, etc) as directed, use it as long as suggested by your surgeon or therapist, typically at least 4-6 weeks.   EXERCISES  Results after joint replacement surgery are often greatly improved when you follow the exercise, range of motion and muscle strengthening exercises prescribed by your doctor. Safety measures are also important to protect the joint from further injury. Any time any of these exercises cause you to have increased pain or swelling, decrease what you are doing until you are comfortable again and then slowly increase them. If you have problems or questions, call your caregiver or physical therapist for advice.   Rehabilitation is important following a joint replacement. After just a few days of immobilization, the muscles of the leg can become weakened and shrink (atrophy).  These exercises are designed to build up the tone and strength of the thigh and leg muscles and to improve motion. Often times heat used for twenty to thirty minutes before working out will loosen up your tissues and help with improving the range of motion but do not use heat for the first two weeks following surgery (sometimes heat can increase post-operative swelling).   These exercises can be done on a training (exercise) mat, on the floor, on a table or on a bed. Use whatever works the best and is most comfortable for you.    Use music or television while you are exercising so that the exercises are a pleasant break in  your day. This will make your life better with the exercises acting as a break in your routine that you can look forward to.   Perform all exercises about fifteen times, three times per day or as directed.  You should exercise both the operative leg and the other leg as well.  Exercises include:   Quad Sets - Tighten up the muscle on the front of the thigh (Quad) and hold for 5-10 seconds.   Straight Leg Raises - With your knee straight (if you were given a brace, keep it on), lift the leg to 60 degrees, hold for 3 seconds, and slowly lower the leg.  Perform this exercise against resistance later as your leg gets stronger.  Leg Slides: Lying on your back, slowly slide your foot toward your buttocks, bending your knee up off the floor (only go as far as is comfortable). Then slowly slide your foot back down until your leg is flat on the floor again.  Angel Wings: Lying on your back spread your legs to the side as far apart as you can without causing discomfort.  Hamstring Strength:  Lying on your back, push your heel against the floor with your leg straight by tightening up the muscles of your buttocks.  Repeat, but this time bend your knee to a comfortable angle, and push your heel against the floor.  You may put a pillow under the heel to  make it more comfortable if necessary.   A rehabilitation program following joint replacement surgery can speed recovery and prevent re-injury in the future due to weakened muscles. Contact your doctor or a physical therapist for more information on knee rehabilitation.    CONSTIPATION  Constipation is defined medically as fewer than three stools per week and severe constipation as less than one stool per week.  Even if you have a regular bowel pattern at home, your normal regimen is likely to be disrupted due to multiple reasons following surgery.  Combination of anesthesia, postoperative narcotics, change in appetite and fluid intake all can affect your bowels.    YOU MUST use at least one of the following options; they are listed in order of increasing strength to get the job done.  They are all available over the counter, and you may need to use some, POSSIBLY even all of these options:    Drink plenty of fluids (prune juice may be helpful) and high fiber foods Colace 100 mg by mouth twice a day  Senokot for constipation as directed and as needed Dulcolax (bisacodyl), take with full glass of water  Miralax (polyethylene glycol) once or twice a day as needed.  If you have tried all these things and are unable to have a bowel movement in the first 3-4 days after surgery call either your surgeon or your primary doctor.    If you experience loose stools or diarrhea, hold the medications until you stool forms back up.  If your symptoms do not get better within 1 week or if they get worse, check with your doctor.  If you experience "the worst abdominal pain ever" or develop nausea or vomiting, please contact the office immediately for further recommendations for treatment.   ITCHING:  If you experience itching with your medications, try taking only a single pain pill, or even half a pain pill at a time.  You can also use Benadryl over the counter for itching or also to help with sleep.   TED HOSE STOCKINGS:  Use stockings on both legs until for at least 2 weeks or as directed by physician office. They may be removed at night for sleeping.  MEDICATIONS:  See your medication summary on the "After Visit Summary" that nursing will review with you.  You may have some home medications which will be placed on hold until you complete the course of blood thinner medication.  It is important for you to complete the blood thinner medication as prescribed.   Blood clot prevention (DVT Prophylaxis): After surgery you are at an increased risk for a blood clot. you were prescribed a blood thinner, Aspirin 81mg , to be taken twice daily for a total of 4 weeks from surgery  to help reduce your risk of getting a blood clot. Continue your plavix as well.  This will help prevent a blood clot. Signs of a pulmonary embolus (blood clot in the lungs) include sudden short of breath, feeling lightheaded or dizzy, chest pain with a deep breath, rapid pulse rapid breathing. Signs of a blood clot in your arms or legs include new unexplained swelling and cramping, warm, red or darkened skin around the painful area. Please call the office or 911 right away if these signs or symptoms develop.  PRECAUTIONS:  If you experience chest pain or shortness of breath - call 911 immediately for transfer to the hospital emergency department.   If you develop a fever greater that 101 F, purulent drainage from  wound, increased redness or drainage from wound, foul odor from the wound/dressing, or calf pain - CONTACT YOUR SURGEON.                                                   FOLLOW-UP APPOINTMENTS:  If you do not already have a post-op appointment, please call the office for an appointment to be seen by your surgeon.  Guidelines for how soon to be seen are listed in your "After Visit Summary", but are typically between 2-3 weeks after surgery.  OTHER INSTRUCTIONS:   Knee Replacement:  Do not place pillow under knee, focus on keeping the knee straight while resting. CPM instructions: 0-90 degrees, 2 hours in the morning, 2 hours in the afternoon, and 2 hours in the evening. Place foam block, curve side up under heel at all times except when in CPM or when walking.  DO NOT modify, tear, cut, or change the foam block in any way.  POST-OPERATIVE OPIOID TAPER INSTRUCTIONS: It is important to wean off of your opioid medication as soon as possible. If you do not need pain medication after your surgery it is ok to stop day one. Opioids include: Codeine, Hydrocodone(Norco, Vicodin), Oxycodone(Percocet, oxycontin) and hydromorphone amongst others.  Long term and even short term use of opiods can  cause: Increased pain response Dependence Constipation Depression Respiratory depression And more.  Withdrawal symptoms can include Flu like symptoms Nausea, vomiting And more Techniques to manage these symptoms Hydrate well Eat regular healthy meals Stay active Use relaxation techniques(deep breathing, meditating, yoga) Do Not substitute Alcohol to help with tapering If you have been on opioids for less than two weeks and do not have pain than it is ok to stop all together.  Plan to wean off of opioids This plan should start within one week post op of your joint replacement. Maintain the same interval or time between taking each dose and first decrease the dose.  Cut the total daily intake of opioids by one tablet each day Next start to increase the time between doses. The last dose that should be eliminated is the evening dose.   MAKE SURE YOU:  Understand these instructions.  Get help right away if you are not doing well or get worse.    Thank you for letting us be a part of your medical care team.  It is a privilege we respect greatly.  We hope these instructions will help you stay on track for a fast and full recovery!            Signed: Marion Rosenberry A Clayborne Divis 03/04/2023, 6:57 AM

## 2023-03-04 NOTE — Progress Notes (Signed)
     Subjective:  Patient reports pain as mild.  No issues overnight. Denies distal n/t. Eager to work with PT and hopeful to go home today.  Objective:   VITALS:   Vitals:   03/03/23 1800 03/03/23 2107 03/04/23 0015 03/04/23 0505  BP: (!) 160/90 138/74 126/81 (!) 142/75  Pulse: 66 61 (!) 59 63  Resp: 14 16 17 16   Temp: 97.6 F (36.4 C) 97.8 F (36.6 C) 98.5 F (36.9 C) 98.4 F (36.9 C)  TempSrc: Oral Oral Oral Oral  SpO2:  96% 94% 95%  Weight:      Height:        Sensation intact distally Intact pulses distally Dorsiflexion/Plantar flexion intact Incision: dressing C/D/I Compartment soft    Lab Results  Component Value Date   WBC 12.1 (H) 03/04/2023   HGB 14.3 03/04/2023   HCT 43.4 03/04/2023   MCV 96.9 03/04/2023   PLT 227 03/04/2023   BMET    Component Value Date/Time   NA 135 03/04/2023 0326   NA 142 01/13/2023 1001   K 3.8 03/04/2023 0326   CL 104 03/04/2023 0326   CO2 24 03/04/2023 0326   GLUCOSE 143 (H) 03/04/2023 0326   BUN 15 03/04/2023 0326   BUN 11 01/13/2023 1001   CREATININE 0.80 03/04/2023 0326   CALCIUM 8.5 (L) 03/04/2023 0326   EGFR 85 01/13/2023 1001   GFRNONAA >60 03/04/2023 0326      Xray: TKA components in good position, no adverse features.  Assessment/Plan: 1 Day Post-Op   Principal Problem:   Primary osteoarthritis of left knee  S/p L TKA 03/03/23  Post op recs: WB: WBAT Abx: ancef Imaging: PACU xrays DVT prophylaxis: Aspirin 81mg  BID x4 weeks, resume Plavix postop day 2 Follow up: 2 weeks after surgery for a wound check with Dr. Blanchie Dessert at [facility].  Address: [address], [place], [place] [address]  Office Phone: [phone]   Joen Laura 03/04/2023, 6:54 AM   Weber Cooks, MD  Contact information:   [phone] 7am-5pm epic message Dr. Blanchie Dessert, or call office for patient follow up: [phone] After hours and holidays please check [email or web address] for group call  information for [facility]

## 2023-03-04 NOTE — TOC Transition Note (Signed)
Transition of Care [facility]) - CM/SW Discharge Note   Patient Details  Name: Benjamin Andrade MRN: [id number] Date of Birth: 1935-05-20  [facility] CM/SW Contact:  Amada Jupiter, LCSW Phone Number: 03/04/2023, 11:07 AM   Clinical Narrative:     Met with pt who confirms need for RW - delivered to room via [employer].  Have confirmed with MD office that HHPT has been arranged with [facility] and contact info placed on AVS.  No further TOC needs.  Final next level of care: Home w Home Health Services Barriers to Discharge: No Barriers Identified   Patient Goals and CMS Choice      Discharge Placement                         Discharge Plan and Services Additional resources added to the After Visit Summary for                  DME Arranged: Walker rolling DME Agency: [employer]       HH Arranged: PT HH Agency: [facility]        Social Determinants of Health (SDOH) Interventions SDOH Screenings   Food Insecurity: No Food Insecurity (03/03/2023)  Housing: Low Risk  (03/03/2023)  Transportation Needs: No Transportation Needs (03/03/2023)  Utilities: Not At Risk (03/03/2023)  Tobacco Use: Medium Risk (03/03/2023)     Readmission Risk Interventions     No data to display

## 2023-03-04 NOTE — Care Management Obs Status (Signed)
MEDICARE OBSERVATION STATUS NOTIFICATION   Patient Details  Name: Benjamin Andrade MRN: [id number] Date of Birth: [birth year]   Medicare Observation Status Notification Given:  Hart Robinsons, LCSW 03/04/2023, 11:39 AM

## 2023-03-29 NOTE — Progress Notes (Signed)
Cardiology Office Note:    Date:  04/01/2023   ID:  Benjamin Andrade, DOB [birth year], MRN [id number]  PCP:  Hadley Pen, MD  Cardiologist:  Norman Herrlich, MD    Referring MD: Hadley Pen, MD    ASSESSMENT:    1. Essential hypertension   2. Coronary artery disease involving native coronary artery of native heart with angina pectoris (HCC)   3. Mixed hyperlipidemia   4. Nonrheumatic aortic valve stenosis    PLAN:    In order of problems listed above:  I suspect his elevated blood pressure was due to surgery back to baseline continue his current antihypertensives edema continues to be a problem we will have to discontinue his low-dose calcium channel blocker.  He will continue beta-blocker telmisartan for 1 week and will not take his diuretic daily and then back to every other day Stable CAD not having anginal discomfort reassuring myocardial perfusion study continue treatment with aspirin beta-blocker and statin Stable mild aortic stenosis   Next appointment: 3 months   Medication Adjustments/Labs and Tests Ordered: Current medicines are reviewed at length with the patient today.  Concerns regarding medicines are outlined above.  No orders of the defined types were placed in this encounter.  No orders of the defined types were placed in this encounter.    History of Present Illness:    Benjamin Andrade is a 88 y.o. male with a hx of CAD with PCI in 2014 and drug-eluting stent left circumflex coronary artery resistant hypertension dyslipidemia mild carotid atherosclerosis and nocturnal pauses in sinus rhythm highly suggestive of sleep apnea last seen 12/17/2021.  He had recent left total knee arthroplasty performed 03/03/2023.  He had a preoperative echocardiogram showing normal left ventricular size wall thickness systolic function and grade 1 diastolic filling pattern normal right ventricular size function pulmonary artery pressure left atrium was dilated there is mitral  regurgitation mild to moderate tricuspid regurgitation mild aortic stenosis mean gradient 13 mmHg.  A myocardial perfusion study performed preoperatively that was low risk normal perfusion normal function.  Compliance with diet, lifestyle and medications: Yes  In the interim he had a total knee arthroplasty and he noticed the first few weeks afterwards his blood pressure was elevated, now back to his baseline of running 130-140/80-90 and has increased edema especially in the left leg where he had total knee arthroplasty. He did not fill the prescription for SGLT2 inhibitor because of cost He is taking and reticulocyte every other day I asked him for 1 week to take the diuretic daily and then decrease back to every other day Due to financial toxicity is not going to fill the other prescription He is not having shortness of breath chest pain palpitation or syncope Past Medical History:  Diagnosis Date   Arthritis    Bradycardia 02/09/2019   Cancer (HCC)    skin-lip-face   Carotid stenosis, bilateral 05/09/2016   Cataract 05/15/2020   Formatting of this note might be different from the original. tramatic catarct OD   Cellulitis of left lower leg 03/07/2021   Chronic gout without tophus 02/21/2017   Chronic insomnia 05/09/2017   Coronary artery disease    Coronary artery disease involving native coronary artery of native heart with angina pectoris (HCC) 08/22/2015   Overview:  80% CX - DES oct 2014 80% CX - DES oct 2014   Diabetes mellitus without complication (HCC)    prediabetic   Dysrhythmia    occ   Essential hypertension  08/22/2015   GERD (gastroesophageal reflux disease)    GERD without esophagitis 05/09/2017   H/O heart artery stent 05/26/2017   Hyperlipemia 08/22/2015   Hypertension    Localized swelling of left lower extremity 03/07/2021   Mixed dyslipidemia 05/09/2017   Myocardial infarction [facility])    Primary osteoarthritis of left knee 03/03/2023   Right-sided chest wall  pain 03/07/2021   Rotator cuff arthropathy 03/28/2016   Screening for colon cancer 05/27/2019   Severe obesity (BMI 35.0-35.9 with comorbidity) (HCC) 11/28/2022   Type 2 diabetes mellitus with hyperglycemia, without long-term current use of insulin (HCC) 05/09/2017   Vitamin D deficiency 05/09/2017    Current Medications: Current Meds  Medication Sig   acetaminophen (TYLENOL) 500 MG tablet Take 2 tablets (1,000 mg total) by mouth every 8 (eight) hours as needed.   amLODipine (NORVASC) 5 MG tablet Take 5 mg by mouth daily.   aspirin EC 81 MG tablet Take 1 tablet (81 mg total) by mouth 2 (two) times daily for 28 days. Swallow whole.   carvedilol (COREG) 25 MG tablet Take 25 mg by mouth 2 (two) times daily with a meal.   clopidogrel (PLAVIX) 75 MG tablet Take 75 mg by mouth every morning.    finasteride (PROSCAR) 5 MG tablet Take 5 mg by mouth daily.   furosemide (LASIX) 20 MG tablet Takes 20 mg (1 tablets) my mouth twice daily for 3 days then 20 mg (1 tablet) daily as needed for swelling.   Multiple Vitamin (MULTI-VITAMINS) TABS Take 1 tablet by mouth every morning.    pantoprazole (PROTONIX) 40 MG tablet Take 40 mg by mouth every morning.    polyethylene glycol (MIRALAX) 17 g packet Take 17 g by mouth daily.   pravastatin (PRAVACHOL) 20 MG tablet TAKE 1 TABLET BY MOUTH IN THE  EVENING   telmisartan (MICARDIS) 80 MG tablet Take 80 mg by mouth at bedtime.      EKGs/Labs/Other Studies Reviewed:    The following studies were reviewed today:  Cardiac Studies & Procedures     STRESS TESTS  MYOCARDIAL PERFUSION IMAGING 01/28/2023  Narrative   The study is normal. The study is low risk.   No ST deviation was noted.   Left ventricular function is normal. Nuclear stress EF: 64 %. The left ventricular ejection fraction is normal (55-65%). End diastolic cavity size is normal.   Prior study not available for comparison.   ECHOCARDIOGRAM  ECHOCARDIOGRAM COMPLETE  01/28/2023  Narrative ECHOCARDIOGRAM REPORT    Patient Name:   Benjamin Andrade Date of Exam: 01/28/2023 Medical Rec #:  [id number]     Height:       68.0 in Accession #:    [id number]    Weight:       228.0 lb Date of Birth:  03-18-1935    BSA:          2.161 m Patient Age:    87 years      BP:           140/90 mmHg Patient Gender: M             HR:           53 bpm. Exam Location:  [place]  Procedure: 2D Echo, Cardiac Doppler, Color Doppler and Intracardiac Opacification Agent  Indications:    Preoperative examination [Z01.818 (ICD-10-CM)]  History:        Patient has prior history of Echocardiogram examinations. Previous Myocardial Infarction; Risk Factors:Hypertension and Diabetes.  Sonographer:  Louie Boston RDCS Referring Phys: [phone] Flossie Dibble   Sonographer Comments: No subcostal window and suboptimal apical window. Global longitudinal strain was attempted. IMPRESSIONS   1. Left ventricular ejection fraction, by estimation, is 60 to 65%. The left ventricle has normal function. The left ventricle has no regional wall motion abnormalities. There is mild left ventricular hypertrophy. Left ventricular diastolic parameters are consistent with Grade I diastolic dysfunction (impaired relaxation). 2. Right ventricular systolic function is normal. The right ventricular size is normal. 3. Left atrial size was severely dilated. 4. The mitral valve is normal in structure. Mild mitral valve regurgitation. No evidence of mitral stenosis. 5. Tricuspid valve regurgitation is mild to moderate. 6. The aortic valve is calcified. There is mild calcification of the aortic valve. There is mild thickening of the aortic valve. Aortic valve regurgitation is not visualized. Mild aortic valve stenosis. Aortic valve area, by VTI measures 1.59 cm. Aortic valve mean gradient measures 12.7 mmHg. 7. The inferior vena cava is normal in size with greater than 50% respiratory variability, suggesting  right atrial pressure of 3 mmHg.  FINDINGS Left Ventricle: Left ventricular ejection fraction, by estimation, is 60 to 65%. The left ventricle has normal function. The left ventricle has no regional wall motion abnormalities. Definity contrast agent was given IV to delineate the left ventricular endocardial borders. The left ventricular internal cavity size was normal in size. There is mild left ventricular hypertrophy. Left ventricular diastolic parameters are consistent with Grade I diastolic dysfunction (impaired relaxation).  Right Ventricle: The right ventricular size is normal. No increase in right ventricular wall thickness. Right ventricular systolic function is normal.  Left Atrium: Left atrial size was severely dilated.  Right Atrium: Right atrial size was normal in size.  Pericardium: There is no evidence of pericardial effusion.  Mitral Valve: The mitral valve is normal in structure. Mild mitral valve regurgitation. No evidence of mitral valve stenosis.  Tricuspid Valve: The tricuspid valve is normal in structure. Tricuspid valve regurgitation is mild to moderate. No evidence of tricuspid stenosis.  Aortic Valve: The aortic valve is calcified. There is mild calcification of the aortic valve. There is mild thickening of the aortic valve. Aortic valve regurgitation is not visualized. Mild aortic stenosis is present. Aortic valve mean gradient measures 12.7 mmHg. Aortic valve peak gradient measures 23.7 mmHg. Aortic valve area, by VTI measures 1.59 cm.  Pulmonic Valve: The pulmonic valve was normal in structure. Pulmonic valve regurgitation is not visualized. No evidence of pulmonic stenosis.  Aorta: The aortic root is normal in size and structure.  Venous: The inferior vena cava is normal in size with greater than 50% respiratory variability, suggesting right atrial pressure of 3 mmHg.  IAS/Shunts: No atrial level shunt detected by color flow Doppler.   LEFT VENTRICLE PLAX  2D LVIDd:         5.10 cm   Diastology LVIDs:         3.20 cm   LV e' medial:    5.22 cm/s LV PW:         1.20 cm   LV E/e' medial:  15.0 LV IVS:        1.40 cm   LV e' lateral:   7.40 cm/s LVOT diam:     2.10 cm   LV E/e' lateral: 10.6 LV SV:         99 LV SV Index:   46 LVOT Area:     3.46 cm   RIGHT VENTRICLE RV Basal  diam:  3.30 cm RV S prime:     12.20 cm/s TAPSE (M-mode): 3.0 cm  LEFT ATRIUM              Index        RIGHT ATRIUM           Index LA diam:        4.90 cm  2.27 cm/m   RA Area:     16.70 cm LA Vol (A2C):   95.7 ml  44.29 ml/m  RA Volume:   43.60 ml  20.18 ml/m LA Vol (A4C):   109.0 ml 50.44 ml/m LA Biplane Vol: 103.0 ml 47.67 ml/m AORTIC VALVE AV Area (Vmax):    1.60 cm AV Area (Vmean):   1.53 cm AV Area (VTI):     1.59 cm AV Vmax:           243.67 cm/s AV Vmean:          162.667 cm/s AV VTI:            0.625 m AV Peak Grad:      23.7 mmHg AV Mean Grad:      12.7 mmHg LVOT Vmax:         112.50 cm/s LVOT Vmean:        72.050 cm/s LVOT VTI:          0.286 m LVOT/AV VTI ratio: 0.46  AORTA Ao Root diam: 3.10 cm Ao Asc diam:  3.65 cm  MV E velocity: 78.10 cm/s   TRICUSPID VALVE MV A velocity: 106.00 cm/s  TR Peak grad:   32.3 mmHg MV E/A ratio:  0.74         TR Vmax:        284.00 cm/s  SHUNTS Systemic VTI:  0.29 m Systemic Diam: 2.10 cm  Gypsy Balsam MD Electronically signed by Gypsy Balsam MD Signature Date/Time: 01/28/2023/3:15:14 PM    Final    MONITORS  LONG TERM MONITOR (3-14 DAYS) 01/07/2022  Narrative Patch Wear Time:  13 days and 17 hours (2023-04-03T14:38:28-0400 to 2023-04-17T07:50:50-0400)  Patient had a min HR of 30 bpm, max HR of 193 bpm, and avg HR of 57 bpm. Predominant underlying rhythm was Sinus Rhythm.  69 Supraventricular Tachycardia runs occurred, the run with the fastest interval lasting 6 beats with a max rate of 193 bpm, the longest lasting 9 mins 33 secs with an avg rate of 110 bpm.  3 Pauses  occurred, the longest lasting 3.8 secs (17 bpm).  All episodes occurred around 4 AM and precipitated by an APC.  There were no episodes of atrial fibrillation or flutter.. Isolated SVEs were occasional (4.0%, 44025), SVE Couplets were rare (<1.0%, 1963), and SVE Triplets were rare (<1.0%, 171).  Did not make  Isolated VEs were rare (<1.0%, 4495), VE Couplets were rare (<1.0%, 797), and VE Triplets were rare (<1.0%, 132). Ventricular Trigeminy was present.               Recent Labs: 01/13/2023: NT-Pro BNP 434 02/18/2023: ALT 18 03/04/2023: BUN 15; Creatinine, Ser 0.80; Hemoglobin 14.3; Platelets 227; Potassium 3.8; Sodium 135  Recent Lipid Panel    Component Value Date/Time   CHOL 118 05/16/2020 0836   TRIG 89 05/16/2020 0836   HDL 36 (L) 05/16/2020 0836   CHOLHDL 3.3 05/16/2020 0836   LDLCALC 65 05/16/2020 0836    Physical Exam:    VS:  BP 136/76 Comment: at home reading this morning  Pulse (!) 54  Ht 5\' 7"  (1.702 m)   Wt 217 lb 3.2 oz (98.5 kg)   SpO2 95%   BMI 34.02 kg/m     Wt Readings from Last 3 Encounters:  04/01/23 217 lb 3.2 oz (98.5 kg)  03/03/23 222 lb 9.6 oz (101 kg)  02/18/23 222 lb 9.6 oz (101 kg)     GEN:  Well nourished, well developed in no acute distress HEENT: Normal NECK: No JVD; No carotid bruits LYMPHATICS: No lymphadenopathy CARDIAC: RRR, no murmurs, rubs, gallops RESPIRATORY:  Clear to auscultation without rales, wheezing or rhonchi  ABDOMEN: Soft, non-tender, non-distended MUSCULOSKELETAL: 2+ bilateral lower extremity pitting edema; No deformity  SKIN: Warm and dry NEUROLOGIC:  Alert and oriented x 3 PSYCHIATRIC:  Normal affect    Signed, Norman Herrlich, MD  04/01/2023 9:34 AM    [facility]

## 2023-04-01 ENCOUNTER — Ambulatory Visit: Payer: Medicare Other | Attending: Cardiology | Admitting: Cardiology

## 2023-04-01 ENCOUNTER — Encounter: Payer: Self-pay | Admitting: Cardiology

## 2023-04-01 VITALS — BP 136/76 | HR 54 | Ht 67.0 in | Wt 217.2 lb

## 2023-04-01 DIAGNOSIS — I350 Nonrheumatic aortic (valve) stenosis: Secondary | ICD-10-CM | POA: Diagnosis not present

## 2023-04-01 DIAGNOSIS — I25119 Atherosclerotic heart disease of native coronary artery with unspecified angina pectoris: Secondary | ICD-10-CM

## 2023-04-01 DIAGNOSIS — I10 Essential (primary) hypertension: Secondary | ICD-10-CM

## 2023-04-01 DIAGNOSIS — E782 Mixed hyperlipidemia: Secondary | ICD-10-CM | POA: Diagnosis not present

## 2023-04-01 MED ORDER — FUROSEMIDE 20 MG PO TABS: 20.0000 mg | ORAL_TABLET | ORAL | 3 refills | Status: AC

## 2023-04-01 NOTE — Patient Instructions (Signed)
Medication Instructions:  Your physician has recommended you make the following change in your medication:   START: Furosemide 20 mg every other day (Take Furosemide daily for 1 week, then every other day) STOP: Jardiance  *If you need a refill on your cardiac medications before your next appointment, please call your pharmacy*   Lab Work: None If you have labs (blood work) drawn today and your tests are completely normal, you will receive your results only by: MyChart Message (if you have MyChart) OR A paper copy in the mail If you have any lab test that is abnormal or we need to change your treatment, we will call you to review the results.   Testing/Procedures: None   Follow-Up: At [facility], you and your health needs are our priority.  As part of our continuing mission to provide you with exceptional heart care, we have created designated Provider Care Teams.  These Care Teams include your primary Cardiologist (physician) and Advanced Practice Providers (APPs -  Physician Assistants and Nurse Practitioners) who all work together to provide you with the care you need, when you need it.  We recommend signing up for the patient portal called "MyChart".  Sign up information is provided on this After Visit Summary.  MyChart is used to connect with patients for Virtual Visits (Telemedicine).  Patients are able to view lab/test results, encounter notes, upcoming appointments, etc.  Non-urgent messages can be sent to your provider as well.   To learn more about what you can do with MyChart, go to [email or web address].    Your next appointment:   3 month(s)  Provider:   Norman Herrlich, MD    Other Instructions None

## 2023-07-04 NOTE — Progress Notes (Unsigned)
Cardiology Office Note:    Date:  07/07/2023   ID:  Benjamin Andrade, DOB 1935/04/28, MRN [id number]  PCP:  Hadley Pen, MD  Cardiologist:  Norman Herrlich, MD    Referring MD: Hadley Pen, MD    ASSESSMENT:    1. Near syncope   2. Coronary artery disease involving native coronary artery of native heart with angina pectoris (HCC)   3. Nonrheumatic aortic valve stenosis   4. Essential hypertension   5. Mixed hyperlipidemia    PLAN:    In order of problems listed above:  Concerning symptoms suggesting symptomatic bradycardia very difficult to manage hypertension despite that we will reduce his beta-blocker dose 50% increase his calcium channel blocker and apply a live monitor for 2 weeks to screen for symptomatic bradycardia.  If unrevealing may benefit from a loop recorder Stable CAD continue his medical therapy clopidogrel beta-blocker and statin He has mild aortic stenosis unrelated to his lightheadedness Well-controlled with ambulatory readings Continue his statin no need to have lipid profile performed   Next appointment: 6 weeks   Medication Adjustments/Labs and Tests Ordered: Current medicines are reviewed at length with the patient today.  Concerns regarding medicines are outlined above.  No orders of the defined types were placed in this encounter.  No orders of the defined types were placed in this encounter.    History of Present Illness:    Benjamin Andrade is a 88 y.o. male with a hx of CAD with PCI and stent left circumflex coronary artery 2014 resistant hypertension dyslipidemia and aortic stenosis mild carotid atherosclerosis and nocturnal sinus pauses suggestive of sleep apnea last seen 04/01/2023.  Recent labs 11/28/2022 potassium 4.6 sodium 136 creatinine 0.9 GFR 83 cc/min. Cholesterol 114 LDL 56 triglycerides 56 HDL 44  Compliance with diet, lifestyle and medications: Yes  From a cardiology perspective he is doing well with angina having none  tolerates his statin tolerates his statin without muscle pain or weakness. He is concerned because he has frequent episodes where he feels as if he may faint is not postural in nature His home blood pressure this morning was 125/76 uses a validated device good technique and typically runs in the range of 1 30-1 40 systolic Past Medical History:  Diagnosis Date   Arthritis    Bradycardia 02/09/2019   Cancer (HCC)    skin-lip-face   Carotid stenosis, bilateral 05/09/2016   Cataract 05/15/2020   Formatting of this note might be different from the original. tramatic catarct OD   Cellulitis of left lower leg 03/07/2021   Chronic gout without tophus 02/21/2017   Chronic insomnia 05/09/2017   Coronary artery disease    Coronary artery disease involving native coronary artery of native heart with angina pectoris (HCC) 08/22/2015   Overview:  80% CX - DES oct 2014 80% CX - DES oct 2014   Diabetes mellitus without complication (HCC)    prediabetic   Dysrhythmia    occ   Essential hypertension 08/22/2015   GERD (gastroesophageal reflux disease)    GERD without esophagitis 05/09/2017   H/O heart artery stent 05/26/2017   Hyperlipemia 08/22/2015   Hypertension    Localized swelling of left lower extremity 03/07/2021   Mixed dyslipidemia 05/09/2017   Myocardial infarction [facility])    Primary osteoarthritis of left knee 03/03/2023   Right-sided chest wall pain 03/07/2021   Rotator cuff arthropathy 03/28/2016   Screening for colon cancer 05/27/2019   Severe obesity (BMI 35.0-35.9 with comorbidity) (HCC) 11/28/2022  Type 2 diabetes mellitus with hyperglycemia, without long-term current use of insulin (HCC) 05/09/2017   Vitamin D deficiency 05/09/2017    Current Medications: Current Meds  Medication Sig   amLODipine (NORVASC) 5 MG tablet Take 5 mg by mouth daily.   carvedilol (COREG) 25 MG tablet Take 25 mg by mouth 2 (two) times daily with a meal.   clopidogrel (PLAVIX) 75 MG tablet Take 75  mg by mouth every morning.    diclofenac (VOLTAREN) 75 MG EC tablet Take 75 mg by mouth 2 (two) times daily.   finasteride (PROSCAR) 5 MG tablet Take 5 mg by mouth daily.   furosemide (LASIX) 20 MG tablet Take 1 tablet (20 mg total) by mouth every other day. Take furosemide daily for 1 week, then every other day.   Multiple Vitamin (MULTI-VITAMINS) TABS Take 1 tablet by mouth every morning.    pantoprazole (PROTONIX) 40 MG tablet Take 40 mg by mouth every morning.    polyethylene glycol (MIRALAX) 17 g packet Take 17 g by mouth daily.   pravastatin (PRAVACHOL) 20 MG tablet TAKE 1 TABLET BY MOUTH IN THE  EVENING   telmisartan (MICARDIS) 80 MG tablet Take 80 mg by mouth at bedtime.      EKGs/Labs/Other Studies Reviewed:    The following studies were reviewed today:  Cardiac Studies & Procedures     STRESS TESTS  MYOCARDIAL PERFUSION IMAGING 01/28/2023  Narrative   The study is normal. The study is low risk.   No ST deviation was noted.   Left ventricular function is normal. Nuclear stress EF: 64 %. The left ventricular ejection fraction is normal (55-65%). End diastolic cavity size is normal.   Prior study not available for comparison.   ECHOCARDIOGRAM  ECHOCARDIOGRAM COMPLETE 01/28/2023  Narrative ECHOCARDIOGRAM REPORT    Patient Name:   Benjamin Andrade Date of Exam: 01/28/2023 Medical Rec #:  [id number]     Height:       68.0 in Accession #:    [id number]    Weight:       228.0 lb Date of Birth:  February 27, 1935    BSA:          2.161 m Patient Age:    87 years      BP:           140/90 mmHg Patient Gender: M             HR:           53 bpm. Exam Location:  [place]  Procedure: 2D Echo, Cardiac Doppler, Color Doppler and Intracardiac Opacification Agent  Indications:    Preoperative examination [Z01.818 (ICD-10-CM)]  History:        Patient has prior history of Echocardiogram examinations. Previous Myocardial Infarction; Risk Factors:Hypertension  and Diabetes.  Sonographer:    Louie Boston RDCS Referring Phys: [id number] Flossie Dibble   Sonographer Comments: No subcostal window and suboptimal apical window. Global longitudinal strain was attempted. IMPRESSIONS   1. Left ventricular ejection fraction, by estimation, is 60 to 65%. The left ventricle has normal function. The left ventricle has no regional wall motion abnormalities. There is mild left ventricular hypertrophy. Left ventricular diastolic parameters are consistent with Grade I diastolic dysfunction (impaired relaxation). 2. Right ventricular systolic function is normal. The right ventricular size is normal. 3. Left atrial size was severely dilated. 4. The mitral valve is normal in structure. Mild mitral valve regurgitation. No evidence of mitral stenosis. 5. Tricuspid valve regurgitation is mild  Cardiology Office Note:    Date:  07/07/2023   ID:  Benjamin Andrade, DOB 1935/04/28, MRN [id number]  PCP:  Hadley Pen, MD  Cardiologist:  Norman Herrlich, MD    Referring MD: Hadley Pen, MD    ASSESSMENT:    1. Near syncope   2. Coronary artery disease involving native coronary artery of native heart with angina pectoris (HCC)   3. Nonrheumatic aortic valve stenosis   4. Essential hypertension   5. Mixed hyperlipidemia    PLAN:    In order of problems listed above:  Concerning symptoms suggesting symptomatic bradycardia very difficult to manage hypertension despite that we will reduce his beta-blocker dose 50% increase his calcium channel blocker and apply a live monitor for 2 weeks to screen for symptomatic bradycardia.  If unrevealing may benefit from a loop recorder Stable CAD continue his medical therapy clopidogrel beta-blocker and statin He has mild aortic stenosis unrelated to his lightheadedness Well-controlled with ambulatory readings Continue his statin no need to have lipid profile performed   Next appointment: 6 weeks   Medication Adjustments/Labs and Tests Ordered: Current medicines are reviewed at length with the patient today.  Concerns regarding medicines are outlined above.  No orders of the defined types were placed in this encounter.  No orders of the defined types were placed in this encounter.    History of Present Illness:    Benjamin Andrade is a 88 y.o. male with a hx of CAD with PCI and stent left circumflex coronary artery 2014 resistant hypertension dyslipidemia and aortic stenosis mild carotid atherosclerosis and nocturnal sinus pauses suggestive of sleep apnea last seen 04/01/2023.  Recent labs 11/28/2022 potassium 4.6 sodium 136 creatinine 0.9 GFR 83 cc/min. Cholesterol 114 LDL 56 triglycerides 56 HDL 44  Compliance with diet, lifestyle and medications: Yes  From a cardiology perspective he is doing well with angina having none  tolerates his statin tolerates his statin without muscle pain or weakness. He is concerned because he has frequent episodes where he feels as if he may faint is not postural in nature His home blood pressure this morning was 125/76 uses a validated device good technique and typically runs in the range of 1 30-1 40 systolic Past Medical History:  Diagnosis Date   Arthritis    Bradycardia 02/09/2019   Cancer (HCC)    skin-lip-face   Carotid stenosis, bilateral 05/09/2016   Cataract 05/15/2020   Formatting of this note might be different from the original. tramatic catarct OD   Cellulitis of left lower leg 03/07/2021   Chronic gout without tophus 02/21/2017   Chronic insomnia 05/09/2017   Coronary artery disease    Coronary artery disease involving native coronary artery of native heart with angina pectoris (HCC) 08/22/2015   Overview:  80% CX - DES oct 2014 80% CX - DES oct 2014   Diabetes mellitus without complication (HCC)    prediabetic   Dysrhythmia    occ   Essential hypertension 08/22/2015   GERD (gastroesophageal reflux disease)    GERD without esophagitis 05/09/2017   H/O heart artery stent 05/26/2017   Hyperlipemia 08/22/2015   Hypertension    Localized swelling of left lower extremity 03/07/2021   Mixed dyslipidemia 05/09/2017   Myocardial infarction [facility])    Primary osteoarthritis of left knee 03/03/2023   Right-sided chest wall pain 03/07/2021   Rotator cuff arthropathy 03/28/2016   Screening for colon cancer 05/27/2019   Severe obesity (BMI 35.0-35.9 with comorbidity) (HCC) 11/28/2022  Type 2 diabetes mellitus with hyperglycemia, without long-term current use of insulin (HCC) 05/09/2017   Vitamin D deficiency 05/09/2017    Current Medications: Current Meds  Medication Sig   amLODipine (NORVASC) 5 MG tablet Take 5 mg by mouth daily.   carvedilol (COREG) 25 MG tablet Take 25 mg by mouth 2 (two) times daily with a meal.   clopidogrel (PLAVIX) 75 MG tablet Take 75  mg by mouth every morning.    diclofenac (VOLTAREN) 75 MG EC tablet Take 75 mg by mouth 2 (two) times daily.   finasteride (PROSCAR) 5 MG tablet Take 5 mg by mouth daily.   furosemide (LASIX) 20 MG tablet Take 1 tablet (20 mg total) by mouth every other day. Take furosemide daily for 1 week, then every other day.   Multiple Vitamin (MULTI-VITAMINS) TABS Take 1 tablet by mouth every morning.    pantoprazole (PROTONIX) 40 MG tablet Take 40 mg by mouth every morning.    polyethylene glycol (MIRALAX) 17 g packet Take 17 g by mouth daily.   pravastatin (PRAVACHOL) 20 MG tablet TAKE 1 TABLET BY MOUTH IN THE  EVENING   telmisartan (MICARDIS) 80 MG tablet Take 80 mg by mouth at bedtime.      EKGs/Labs/Other Studies Reviewed:    The following studies were reviewed today:  Cardiac Studies & Procedures     STRESS TESTS  MYOCARDIAL PERFUSION IMAGING 01/28/2023  Narrative   The study is normal. The study is low risk.   No ST deviation was noted.   Left ventricular function is normal. Nuclear stress EF: 64 %. The left ventricular ejection fraction is normal (55-65%). End diastolic cavity size is normal.   Prior study not available for comparison.   ECHOCARDIOGRAM  ECHOCARDIOGRAM COMPLETE 01/28/2023  Narrative ECHOCARDIOGRAM REPORT    Patient Name:   Benjamin Andrade Date of Exam: 01/28/2023 Medical Rec #:  [id number]     Height:       68.0 in Accession #:    [id number]    Weight:       228.0 lb Date of Birth:  February 27, 1935    BSA:          2.161 m Patient Age:    87 years      BP:           140/90 mmHg Patient Gender: M             HR:           53 bpm. Exam Location:  [place]  Procedure: 2D Echo, Cardiac Doppler, Color Doppler and Intracardiac Opacification Agent  Indications:    Preoperative examination [Z01.818 (ICD-10-CM)]  History:        Patient has prior history of Echocardiogram examinations. Previous Myocardial Infarction; Risk Factors:Hypertension  and Diabetes.  Sonographer:    Louie Boston RDCS Referring Phys: [id number] Flossie Dibble   Sonographer Comments: No subcostal window and suboptimal apical window. Global longitudinal strain was attempted. IMPRESSIONS   1. Left ventricular ejection fraction, by estimation, is 60 to 65%. The left ventricle has normal function. The left ventricle has no regional wall motion abnormalities. There is mild left ventricular hypertrophy. Left ventricular diastolic parameters are consistent with Grade I diastolic dysfunction (impaired relaxation). 2. Right ventricular systolic function is normal. The right ventricular size is normal. 3. Left atrial size was severely dilated. 4. The mitral valve is normal in structure. Mild mitral valve regurgitation. No evidence of mitral stenosis. 5. Tricuspid valve regurgitation is mild  Cardiology Office Note:    Date:  07/07/2023   ID:  Benjamin Andrade, DOB 1935/04/28, MRN [id number]  PCP:  Hadley Pen, MD  Cardiologist:  Norman Herrlich, MD    Referring MD: Hadley Pen, MD    ASSESSMENT:    1. Near syncope   2. Coronary artery disease involving native coronary artery of native heart with angina pectoris (HCC)   3. Nonrheumatic aortic valve stenosis   4. Essential hypertension   5. Mixed hyperlipidemia    PLAN:    In order of problems listed above:  Concerning symptoms suggesting symptomatic bradycardia very difficult to manage hypertension despite that we will reduce his beta-blocker dose 50% increase his calcium channel blocker and apply a live monitor for 2 weeks to screen for symptomatic bradycardia.  If unrevealing may benefit from a loop recorder Stable CAD continue his medical therapy clopidogrel beta-blocker and statin He has mild aortic stenosis unrelated to his lightheadedness Well-controlled with ambulatory readings Continue his statin no need to have lipid profile performed   Next appointment: 6 weeks   Medication Adjustments/Labs and Tests Ordered: Current medicines are reviewed at length with the patient today.  Concerns regarding medicines are outlined above.  No orders of the defined types were placed in this encounter.  No orders of the defined types were placed in this encounter.    History of Present Illness:    Benjamin Andrade is a 88 y.o. male with a hx of CAD with PCI and stent left circumflex coronary artery 2014 resistant hypertension dyslipidemia and aortic stenosis mild carotid atherosclerosis and nocturnal sinus pauses suggestive of sleep apnea last seen 04/01/2023.  Recent labs 11/28/2022 potassium 4.6 sodium 136 creatinine 0.9 GFR 83 cc/min. Cholesterol 114 LDL 56 triglycerides 56 HDL 44  Compliance with diet, lifestyle and medications: Yes  From a cardiology perspective he is doing well with angina having none  tolerates his statin tolerates his statin without muscle pain or weakness. He is concerned because he has frequent episodes where he feels as if he may faint is not postural in nature His home blood pressure this morning was 125/76 uses a validated device good technique and typically runs in the range of 1 30-1 40 systolic Past Medical History:  Diagnosis Date   Arthritis    Bradycardia 02/09/2019   Cancer (HCC)    skin-lip-face   Carotid stenosis, bilateral 05/09/2016   Cataract 05/15/2020   Formatting of this note might be different from the original. tramatic catarct OD   Cellulitis of left lower leg 03/07/2021   Chronic gout without tophus 02/21/2017   Chronic insomnia 05/09/2017   Coronary artery disease    Coronary artery disease involving native coronary artery of native heart with angina pectoris (HCC) 08/22/2015   Overview:  80% CX - DES oct 2014 80% CX - DES oct 2014   Diabetes mellitus without complication (HCC)    prediabetic   Dysrhythmia    occ   Essential hypertension 08/22/2015   GERD (gastroesophageal reflux disease)    GERD without esophagitis 05/09/2017   H/O heart artery stent 05/26/2017   Hyperlipemia 08/22/2015   Hypertension    Localized swelling of left lower extremity 03/07/2021   Mixed dyslipidemia 05/09/2017   Myocardial infarction [facility])    Primary osteoarthritis of left knee 03/03/2023   Right-sided chest wall pain 03/07/2021   Rotator cuff arthropathy 03/28/2016   Screening for colon cancer 05/27/2019   Severe obesity (BMI 35.0-35.9 with comorbidity) (HCC) 11/28/2022

## 2023-07-07 ENCOUNTER — Ambulatory Visit: Payer: Medicare Other | Attending: Cardiology

## 2023-07-07 ENCOUNTER — Ambulatory Visit: Payer: Medicare Other | Attending: Cardiology | Admitting: Cardiology

## 2023-07-07 ENCOUNTER — Encounter: Payer: Self-pay | Admitting: Cardiology

## 2023-07-07 VITALS — BP 168/80 | HR 51 | Ht 67.0 in | Wt 217.0 lb

## 2023-07-07 DIAGNOSIS — E782 Mixed hyperlipidemia: Secondary | ICD-10-CM | POA: Diagnosis not present

## 2023-07-07 DIAGNOSIS — I25118 Atherosclerotic heart disease of native coronary artery with other forms of angina pectoris: Secondary | ICD-10-CM

## 2023-07-07 DIAGNOSIS — R001 Bradycardia, unspecified: Secondary | ICD-10-CM | POA: Diagnosis not present

## 2023-07-07 DIAGNOSIS — I350 Nonrheumatic aortic (valve) stenosis: Secondary | ICD-10-CM

## 2023-07-07 DIAGNOSIS — R55 Syncope and collapse: Secondary | ICD-10-CM | POA: Diagnosis not present

## 2023-07-07 DIAGNOSIS — I10 Essential (primary) hypertension: Secondary | ICD-10-CM

## 2023-07-07 DIAGNOSIS — I25119 Atherosclerotic heart disease of native coronary artery with unspecified angina pectoris: Secondary | ICD-10-CM

## 2023-07-07 MED ORDER — AMLODIPINE BESYLATE 5 MG PO TABS: 5.0000 mg | ORAL_TABLET | Freq: Two times a day (BID) | ORAL | 3 refills | Status: AC

## 2023-07-07 MED ORDER — CARVEDILOL 12.5 MG PO TABS: 12.5000 mg | ORAL_TABLET | Freq: Two times a day (BID) | ORAL | 3 refills | Status: AC

## 2023-07-07 NOTE — Patient Instructions (Signed)
Medication Instructions:  Your physician has recommended you make the following change in your medication:   START: Carvedilol 12.5 mg two times daily START: Norvasc 5 mg two times daily  *If you need a refill on your cardiac medications before your next appointment, please call your pharmacy*   Lab Work: Your physician recommends that you return for lab work in:   Labs today: CMP, Lipids  If you have labs (blood work) drawn today and your tests are completely normal, you will receive your results only by: MyChart Message (if you have MyChart) OR A paper copy in the mail If you have any lab test that is abnormal or we need to change your treatment, we will call you to review the results.   Testing/Procedures: A zio monitor was ordered today. It will remain on for 14 days. You will then return monitor and event diary in provided box. It takes 1-2 weeks for report to be downloaded and returned to [place]. We will call you with the results. If monitor falls off or has orange flashing light, please call [employer] for further instructions.   Follow-Up: At [facility], you and your health needs are our priority.  As part of our continuing mission to provide you with exceptional heart care, we have created designated Provider Care Teams.  These Care Teams include your primary Cardiologist (physician) and Advanced Practice Providers (APPs -  Physician Assistants and Nurse Practitioners) who all work together to provide you with the care you need, when you need it.  We recommend signing up for the patient portal called "MyChart".  Sign up information is provided on this After Visit Summary.  MyChart is used to connect with patients for Virtual Visits (Telemedicine).  Patients are able to view lab/test results, encounter notes, upcoming appointments, etc.  Non-urgent messages can be sent to your provider as well.   To learn more about what you can do with MyChart, go to [email or web address].     Your next appointment:   6 week(s)  Provider:   Norman Herrlich, MD    Other Instructions None

## 2023-07-07 NOTE — Addendum Note (Signed)
Addended by: Roxanne Mins I on: 07/07/2023 09:36 AM   Modules accepted: Orders

## 2023-07-08 LAB — COMPREHENSIVE METABOLIC PANEL
ALT: 16 [IU]/L (ref 0–44)
AST: 17 [IU]/L (ref 0–40)
Albumin: 4.2 g/dL (ref 3.7–4.7)
Alkaline Phosphatase: 76 [IU]/L (ref 44–121)
BUN/Creatinine Ratio: 16 (ref 10–24)
BUN: 14 mg/dL (ref 8–27)
Bilirubin Total: 0.3 mg/dL (ref 0.0–1.2)
CO2: 25 mmol/L (ref 20–29)
Calcium: 9.1 mg/dL (ref 8.6–10.2)
Chloride: 102 mmol/L (ref 96–106)
Creatinine, Ser: 0.86 mg/dL (ref 0.76–1.27)
Globulin, Total: 2.3 g/dL (ref 1.5–4.5)
Glucose: 109 mg/dL — ABNORMAL HIGH (ref 70–99)
Potassium: 4.4 mmol/L (ref 3.5–5.2)
Sodium: 139 mmol/L (ref 134–144)
Total Protein: 6.5 g/dL (ref 6.0–8.5)
eGFR: 85 mL/min/{1.73_m2} (ref >59)

## 2023-07-08 LAB — LIPID PANEL
Chol/HDL Ratio: 3 ratio (ref 0.0–5.0)
Cholesterol, Total: 123 mg/dL (ref 100–199)
HDL: 41 mg/dL (ref >39)
LDL Chol Calc (NIH): 65 mg/dL (ref 0–99)
Triglycerides: 84 mg/dL (ref 0–149)
VLDL Cholesterol Cal: 17 mg/dL (ref 5–40)

## 2023-07-10 ENCOUNTER — Telehealth: Payer: Self-pay | Admitting: Cardiology

## 2023-07-10 ENCOUNTER — Telehealth: Payer: Self-pay

## 2023-07-10 NOTE — Telephone Encounter (Signed)
Received a call from Alisa at [facility] with a alert for this patient. Patient triggered the monitor at 8:18 am and it showed that he had a sinus bradycardia of 38 that lasted for 60 seconds. Serina Cowper stated that she called the patient and the patient could not hear her but she stated that he was fine. I will forward to Dr. Dulce Sellar for further guidance.

## 2023-07-10 NOTE — Telephone Encounter (Signed)
New Message>    Constance Holster is calling with abnormal results

## 2023-07-10 NOTE — Telephone Encounter (Signed)
Spoke with Benjamin Andrade from [facility] who reports that the pt had symptomatic bradycardia at 8:18 am. Rate was 39 bpm lasting 60 seconds.   Spoke with Benjamin Andrade per DPR she states that pt is doing good other than the bouts of dizziness. Pt is not at home at this time.

## 2023-07-10 NOTE — Telephone Encounter (Signed)
Monitor strips sent from 8:18 AM showing sinus bradycardia 39 bpm brief episode of accelerated idioventricular rhythm no heart block.

## 2023-08-11 ENCOUNTER — Other Ambulatory Visit: Payer: Self-pay

## 2023-08-11 DIAGNOSIS — R002 Palpitations: Secondary | ICD-10-CM

## 2023-08-11 NOTE — Progress Notes (Unsigned)
Amb ref ep

## 2023-08-19 ENCOUNTER — Encounter: Payer: Self-pay | Admitting: Cardiology

## 2023-08-24 NOTE — Progress Notes (Unsigned)
Cardiology Office Note:    Date:  08/25/2023   ID:  Benjamin Andrade, DOB 1934-10-18, MRN [id number]  PCP:  Benjamin Pen, MD  Cardiologist:  Benjamin Herrlich, MD    Referring MD: Benjamin Pen, MD    ASSESSMENT:    1. Near syncope   2. Bradycardia   3. SVT (supraventricular tachycardia) (HCC)   4. Coronary artery disease involving native coronary artery of native heart with angina pectoris (HCC)   5. Nonrheumatic aortic valve stenosis   6. Essential hypertension   7. Mixed hyperlipidemia    PLAN:    In order of problems listed above:  His monitor showed bradycardia intermittent junctional rhythm symptomatic as well as episodes of SVT I have reduced his beta-blocker he continues to be symptomatic I think he would benefit from an EP consult consideration of implanted loop recorder Stable CAD is having no angina we will continue his treatment including clopidogrel reduced dose of beta-blocker calcium channel blocker and lipid-lowering statin Systolic prominent murmur mild aortic stenosis Adequate control of hypertension with his aortic stenosis and episodes of near syncope I would not intensify antihypertensive treatment at this time Continue his statin   Next appointment: 6 months   Medication Adjustments/Labs and Tests Ordered: Current medicines are reviewed at length with the patient today.  Concerns regarding medicines are outlined above.  No orders of the defined types were placed in this encounter.  No orders of the defined types were placed in this encounter.    History of Present Illness:    Benjamin Andrade is a 88 y.o. male with a hx of CAD with PCI and stent left circumflex coronary artery 2014 resistant hypertension dyslipidemia and mild aortic stenosis mild carotid atherosclerosis and nocturnal sinus pauses last seen 07/07/2023 with episodes of near syncope.  He had an event monitor reported 08/02/2026 with bradycardia and episodes of accelerated  idioventricular rhythm 69 episodes of SVT occasional junctional rhythm.  His beta-blocker dose was decreased 50%.  Compliance with diet, lifestyle and medications: Yes  He continues to have episodes of near syncope. I am unsure why but he is never scheduled yet to be seen by EP is already had a phone call on Friday to tell me he had an appointment with Benjamin Andrade today Thinking benefit from implanted loop recorder and I will be sure to reschedule before he leaves His wife fell unfortunately fractured her wrist but he is doing well he has not had syncope edema shortness of breath or chest pain He has a prominent systolic ejection murmur but has only mild aortic stenosis Past Medical History:  Diagnosis Date   Arthritis    Bradycardia 02/09/2019   Cancer (HCC)    skin-lip-face   Carotid stenosis, bilateral 05/09/2016   Cataract 05/15/2020   Formatting of this note might be different from the original. tramatic catarct OD   Cellulitis of left lower leg 03/07/2021   Chronic gout without tophus 02/21/2017   Chronic insomnia 05/09/2017   Coronary artery disease    Coronary artery disease involving native coronary artery of native heart with angina pectoris (HCC) 08/22/2015   Overview:  80% CX - DES oct 2014 80% CX - DES oct 2014   Diabetes mellitus without complication (HCC)    prediabetic   Dysrhythmia    occ   Essential hypertension 08/22/2015   GERD (gastroesophageal reflux disease)    GERD without esophagitis 05/09/2017   H/O heart artery stent 05/26/2017   Hyperlipemia 08/22/2015  Hypertension    Localized swelling of left lower extremity 03/07/2021   Mixed dyslipidemia 05/09/2017   Myocardial infarction [facility])    Primary osteoarthritis of left knee 03/03/2023   Right-sided chest wall pain 03/07/2021   Rotator cuff arthropathy 03/28/2016   Screening for colon cancer 05/27/2019   Severe obesity (BMI 35.0-35.9 with comorbidity) (HCC) 11/28/2022   Type 2 diabetes mellitus with  hyperglycemia, without long-term current use of insulin (HCC) 05/09/2017   Vitamin D deficiency 05/09/2017    Current Medications: Current Meds  Medication Sig   amLODipine (NORVASC) 5 MG tablet Take 1 tablet (5 mg total) by mouth 2 (two) times daily.   carvedilol (COREG) 12.5 MG tablet Take 1 tablet (12.5 mg total) by mouth 2 (two) times daily.   clopidogrel (PLAVIX) 75 MG tablet Take 75 mg by mouth every morning.    diclofenac (VOLTAREN) 75 MG EC tablet Take 75 mg by mouth 2 (two) times daily.   finasteride (PROSCAR) 5 MG tablet Take 5 mg by mouth daily.   furosemide (LASIX) 20 MG tablet Take 1 tablet (20 mg total) by mouth every other day. Take furosemide daily for 1 week, then every other day.   Multiple Vitamin (MULTI-VITAMINS) TABS Take 1 tablet by mouth every morning.    pantoprazole (PROTONIX) 40 MG tablet Take 40 mg by mouth every morning.    polyethylene glycol (MIRALAX) 17 g packet Take 17 g by mouth daily.   pravastatin (PRAVACHOL) 20 MG tablet TAKE 1 TABLET BY MOUTH IN THE  EVENING   telmisartan (MICARDIS) 80 MG tablet Take 80 mg by mouth at bedtime.      EKGs/Labs/Other Studies Reviewed:    The following studies were reviewed today:  Cardiac Studies & Procedures     STRESS TESTS  MYOCARDIAL PERFUSION IMAGING 01/28/2023  Narrative   The study is normal. The study is low risk.   No ST deviation was noted.   Left ventricular function is normal. Nuclear stress EF: 64 %. The left ventricular ejection fraction is normal (55-65%). End diastolic cavity size is normal.   Prior study not available for comparison.   ECHOCARDIOGRAM  ECHOCARDIOGRAM COMPLETE 01/28/2023  Narrative ECHOCARDIOGRAM REPORT    Patient Name:   Benjamin Andrade Date of Exam: 01/28/2023 Medical Rec #:  [id number]     Height:       68.0 in Accession #:    [id number]    Weight:       228.0 lb Date of Birth:  1935-07-27    BSA:          2.161 m Patient Age:    87 years      BP:           140/90  mmHg Patient Gender: M             HR:           53 bpm. Exam Location:  [place]  Procedure: 2D Echo, Cardiac Doppler, Color Doppler and Intracardiac Opacification Agent  Indications:    Preoperative examination [Z01.818 (ICD-10-CM)]  History:        Patient has prior history of Echocardiogram examinations. Previous Myocardial Infarction; Risk Factors:Hypertension and Diabetes.  Sonographer:    Louie Boston RDCS Referring Phys: [phone] Flossie Dibble   Sonographer Comments: No subcostal window and suboptimal apical window. Global longitudinal strain was attempted. IMPRESSIONS   1. Left ventricular ejection fraction, by estimation, is 60 to 65%. The left ventricle has normal function. The left ventricle  has no regional wall motion abnormalities. There is mild left ventricular hypertrophy. Left ventricular diastolic parameters are consistent with Grade I diastolic dysfunction (impaired relaxation). 2. Right ventricular systolic function is normal. The right ventricular size is normal. 3. Left atrial size was severely dilated. 4. The mitral valve is normal in structure. Mild mitral valve regurgitation. No evidence of mitral stenosis. 5. Tricuspid valve regurgitation is mild to moderate. 6. The aortic valve is calcified. There is mild calcification of the aortic valve. There is mild thickening of the aortic valve. Aortic valve regurgitation is not visualized. Mild aortic valve stenosis. Aortic valve area, by VTI measures 1.59 cm. Aortic valve mean gradient measures 12.7 mmHg. 7. The inferior vena cava is normal in size with greater than 50% respiratory variability, suggesting right atrial pressure of 3 mmHg.  FINDINGS Left Ventricle: Left ventricular ejection fraction, by estimation, is 60 to 65%. The left ventricle has normal function. The left ventricle has no regional wall motion abnormalities. Definity contrast agent was given IV to delineate the left ventricular endocardial borders.  The left ventricular internal cavity size was normal in size. There is mild left ventricular hypertrophy. Left ventricular diastolic parameters are consistent with Grade I diastolic dysfunction (impaired relaxation).  Right Ventricle: The right ventricular size is normal. No increase in right ventricular wall thickness. Right ventricular systolic function is normal.  Left Atrium: Left atrial size was severely dilated.  Right Atrium: Right atrial size was normal in size.  Pericardium: There is no evidence of pericardial effusion.  Mitral Valve: The mitral valve is normal in structure. Mild mitral valve regurgitation. No evidence of mitral valve stenosis.  Tricuspid Valve: The tricuspid valve is normal in structure. Tricuspid valve regurgitation is mild to moderate. No evidence of tricuspid stenosis.  Aortic Valve: The aortic valve is calcified. There is mild calcification of the aortic valve. There is mild thickening of the aortic valve. Aortic valve regurgitation is not visualized. Mild aortic stenosis is present. Aortic valve mean gradient measures 12.7 mmHg. Aortic valve peak gradient measures 23.7 mmHg. Aortic valve area, by VTI measures 1.59 cm.  Pulmonic Valve: The pulmonic valve was normal in structure. Pulmonic valve regurgitation is not visualized. No evidence of pulmonic stenosis.  Aorta: The aortic root is normal in size and structure.  Venous: The inferior vena cava is normal in size with greater than 50% respiratory variability, suggesting right atrial pressure of 3 mmHg.  IAS/Shunts: No atrial level shunt detected by color flow Doppler.   LEFT VENTRICLE PLAX 2D LVIDd:         5.10 cm   Diastology LVIDs:         3.20 cm   LV e' medial:    5.22 cm/s LV PW:         1.20 cm   LV E/e' medial:  15.0 LV IVS:        1.40 cm   LV e' lateral:   7.40 cm/s LVOT diam:     2.10 cm   LV E/e' lateral: 10.6 LV SV:         99 LV SV Index:   46 LVOT Area:     3.46 cm   RIGHT  VENTRICLE RV Basal diam:  3.30 cm RV S prime:     12.20 cm/s TAPSE (M-mode): 3.0 cm  LEFT ATRIUM              Index        RIGHT ATRIUM  Index LA diam:        4.90 cm  2.27 cm/m   RA Area:     16.70 cm LA Vol (A2C):   95.7 ml  44.29 ml/m  RA Volume:   43.60 ml  20.18 ml/m LA Vol (A4C):   109.0 ml 50.44 ml/m LA Biplane Vol: 103.0 ml 47.67 ml/m AORTIC VALVE AV Area (Vmax):    1.60 cm AV Area (Vmean):   1.53 cm AV Area (VTI):     1.59 cm AV Vmax:           243.67 cm/s AV Vmean:          162.667 cm/s AV VTI:            0.625 m AV Peak Grad:      23.7 mmHg AV Mean Grad:      12.7 mmHg LVOT Vmax:         112.50 cm/s LVOT Vmean:        72.050 cm/s LVOT VTI:          0.286 m LVOT/AV VTI ratio: 0.46  AORTA Ao Root diam: 3.10 cm Ao Asc diam:  3.65 cm  MV E velocity: 78.10 cm/s   TRICUSPID VALVE MV A velocity: 106.00 cm/s  TR Peak grad:   32.3 mmHg MV E/A ratio:  0.74         TR Vmax:        284.00 cm/s  SHUNTS Systemic VTI:  0.29 m Systemic Diam: 2.10 cm  Gypsy Balsam MD Electronically signed by Gypsy Balsam MD Signature Date/Time: 01/28/2023/3:15:14 PM    Final    MONITORS  LONG TERM MONITOR (3-14 DAYS) 07/07/2023               Recent Labs: 01/13/2023: NT-Pro BNP 434 03/04/2023: Hemoglobin 14.3; Platelets 227 07/07/2023: ALT 16; BUN 14; Creatinine, Ser 0.86; Potassium 4.4; Sodium 139  Recent Lipid Panel    Component Value Date/Time   CHOL 123 07/07/2023 0954   TRIG 84 07/07/2023 0954   HDL 41 07/07/2023 0954   CHOLHDL 3.0 07/07/2023 0954   LDLCALC 65 07/07/2023 0954    Physical Exam:    VS:  BP (!) 150/74   Pulse (!) 50   Ht 5\' 6"  (1.676 m)   Wt 215 lb 6.4 oz (97.7 kg)   SpO2 94%   BMI 34.77 kg/m     Wt Readings from Last 3 Encounters:  08/25/23 215 lb 6.4 oz (97.7 kg)  07/07/23 217 lb (98.4 kg)  04/01/23 217 lb 3.2 oz (98.5 kg)     GEN:   Well nourished, well developed in no acute distress HEENT: Normal NECK: No JVD;  No carotid bruits LYMPHATICS: No lymphadenopathy CARDIAC: 2/6 to 3/6 systolic murmur aortic stenosis RRR, no murmurs, rubs, gallops RESPIRATORY:  Clear to auscultation without rales, wheezing or rhonchi  ABDOMEN: Soft, non-tender, non-distended MUSCULOSKELETAL:  No edema; No deformity  SKIN: Warm and dry NEUROLOGIC:  Alert and oriented x 3 PSYCHIATRIC:  Normal affect    Signed, Benjamin Herrlich, MD  08/25/2023 9:00 AM    [facility]

## 2023-08-25 ENCOUNTER — Encounter: Payer: Self-pay | Admitting: Cardiology

## 2023-08-25 ENCOUNTER — Ambulatory Visit: Payer: Medicare Other | Attending: Cardiology | Admitting: Cardiology

## 2023-08-25 VITALS — BP 150/74 | HR 50 | Ht 66.0 in | Wt 215.4 lb

## 2023-08-25 DIAGNOSIS — R55 Syncope and collapse: Secondary | ICD-10-CM

## 2023-08-25 DIAGNOSIS — I471 Supraventricular tachycardia, unspecified: Secondary | ICD-10-CM | POA: Diagnosis not present

## 2023-08-25 DIAGNOSIS — R001 Bradycardia, unspecified: Secondary | ICD-10-CM

## 2023-08-25 DIAGNOSIS — I25119 Atherosclerotic heart disease of native coronary artery with unspecified angina pectoris: Secondary | ICD-10-CM | POA: Diagnosis not present

## 2023-08-25 DIAGNOSIS — E782 Mixed hyperlipidemia: Secondary | ICD-10-CM

## 2023-08-25 DIAGNOSIS — I350 Nonrheumatic aortic (valve) stenosis: Secondary | ICD-10-CM

## 2023-08-25 DIAGNOSIS — I10 Essential (primary) hypertension: Secondary | ICD-10-CM

## 2023-08-25 NOTE — Patient Instructions (Signed)

## 2023-10-18 ENCOUNTER — Other Ambulatory Visit: Payer: Self-pay | Admitting: Cardiology

## 2023-10-30 ENCOUNTER — Ambulatory Visit: Payer: Medicare Other | Attending: Cardiology | Admitting: Cardiology

## 2023-10-30 NOTE — Progress Notes (Deleted)
  Electrophysiology Office Note:   Date:  10/30/2023  ID:  RYLIN SEAVEY, DOB 1935/09/04, MRN [id number]  Primary Cardiologist: Norman Herrlich, MD Primary Heart Failure: None Electrophysiologist: None  {Click to update primary MD,subspecialty MD or APP then REFRESH:1}    History of Present Illness:   Benjamin Andrade is a 89 y.o. male with h/o coronary artery disease post PCI, hypertension, hyperlipidemia, mild aortic stenosis, mild carotid atherosclerosis, PVCs, near syncope seen today for  for Electrophysiology evaluation of PVCs/near syncope at the request of Norman Herrlich.    He has been having several episodes where he feels faint.  This is not postural in nature.  Blood pressures are well-controlled at home.***  Review of systems complete and found to be negative unless listed in HPI.   EP Information / Studies Reviewed:    {EKGtoday:28818}        Risk Assessment/Calculations:     No BP recorded.  {Refresh Note OR Click here to enter BP  :1}***        Physical Exam:   VS:  There were no vitals taken for this visit.   Wt Readings from Last 3 Encounters:  08/25/23 215 lb 6.4 oz (97.7 kg)  07/07/23 217 lb (98.4 kg)  04/01/23 217 lb 3.2 oz (98.5 kg)     GEN: Well nourished, well developed in no acute distress NECK: No JVD; No carotid bruits CARDIAC: {EPRHYTHM:28826}, no murmurs, rubs, gallops RESPIRATORY:  Clear to auscultation without rales, wheezing or rhonchi  ABDOMEN: Soft, non-tender, non-distended EXTREMITIES:  No edema; No deformity   ASSESSMENT AND PLAN:    1.  Near syncope:***  2.  SVT:***  3.  Coronary artery disease: No current chest pain.  Plan per primary cardiology.  4.  Hypertension:***  Follow up with {WUJWJ:19147} {EPFOLLOW WG:95621}  Signed, Demico Ploch Jorja Loa, MD

## 2023-12-24 ENCOUNTER — Encounter: Payer: Self-pay | Admitting: Cardiology

## 2023-12-24 ENCOUNTER — Ambulatory Visit: Payer: Medicare Other | Attending: Cardiology | Admitting: Cardiology

## 2023-12-24 VITALS — BP 138/82 | HR 51 | Ht 66.0 in | Wt 207.0 lb

## 2023-12-24 DIAGNOSIS — R002 Palpitations: Secondary | ICD-10-CM | POA: Diagnosis not present

## 2023-12-24 DIAGNOSIS — I471 Supraventricular tachycardia, unspecified: Secondary | ICD-10-CM | POA: Diagnosis not present

## 2023-12-24 DIAGNOSIS — R001 Bradycardia, unspecified: Secondary | ICD-10-CM | POA: Diagnosis not present

## 2023-12-24 DIAGNOSIS — R55 Syncope and collapse: Secondary | ICD-10-CM

## 2023-12-24 DIAGNOSIS — I2510 Atherosclerotic heart disease of native coronary artery without angina pectoris: Secondary | ICD-10-CM

## 2023-12-24 DIAGNOSIS — I10 Essential (primary) hypertension: Secondary | ICD-10-CM

## 2023-12-24 NOTE — Patient Instructions (Addendum)
 Medication Instructions:  Your physician recommends that you continue on your current medications as directed. Please refer to the Current Medication list given to you today.  Labwork: None ordered.  Testing/Procedures: None ordered.  Follow-Up:  You will follow up with Dr Elberta Fortis only as needed.   Implantable Loop Recorder Placement, Care After This sheet gives you information about how to care for yourself after your procedure. Your health care provider may also give you more specific instructions. If you have problems or questions, contact your health care provider. What can I expect after the procedure? After the procedure, it is common to have: Soreness or discomfort near the incision. Some swelling or bruising near the incision.  Follow these instructions at home: Incision care  Monitor your cardiac device site for redness, swelling, and drainage. Call the device clinic at [phone] if you experience these symptoms or fever/chills.  Keep the large square bandage on your site for 24 hours and then you may remove it yourself. Keep the steri-strips underneath in place.   You may shower after 72 hours / 3 days from your procedure with the steri-strips in place. They will usually fall off on their own, or may be removed after 10 days. Pat dry.   Avoid lotions, ointments, or perfumes over your incision until it is well-healed.  Please do not submerge in water until your site is completely healed.   Your device is MRI compatible.   Remote monitoring is used to monitor your cardiac device from home. This monitoring is scheduled every month by our office. It allows [place] to keep an eye on the function of your device to ensure it is working properly.  If your wound site starts to bleed apply pressure.    For help with the monitor please call [employer] Monitor Support Specialist directly at [phone].    If you have any questions/concerns please call the device clinic at  [phone].  Activity  Return to your normal activities.  General instructions Follow instructions from your health care provider about how to manage your implantable loop recorder and transmit the information. Learn how to activate a recording if this is necessary for your type of device. You may go through a metal detection gate, and you may let someone hold a metal detector over your chest. Show your ID card if needed. Do not have an MRI unless you check with your health care provider first. Take over-the-counter and prescription medicines only as told by your health care provider. Keep all follow-up visits as told by your health care provider. This is important. Contact a health care provider if: You have redness, swelling, or pain around your incision. You have a fever. You have pain that is not relieved by your pain medicine. You have triggered your device because of fainting (syncope) or because of a heartbeat that feels like it is racing, slow, fluttering, or skipping (palpitations). Get help right away if you have: Chest pain. Difficulty breathing. Summary After the procedure, it is common to have soreness or discomfort near the incision. Change your dressing as told by your health care provider. Follow instructions from your health care provider about how to manage your implantable loop recorder and transmit the information. Keep all follow-up visits as told by your health care provider. This is important. This information is not intended to replace advice given to you by your health care provider. Make sure you discuss any questions you have with your health care provider. Document Released: 08/14/2015 Document Revised:  10/18/2017 Document Reviewed: 10/18/2017 Elsevier Patient Education  2020 [employer].

## 2023-12-24 NOTE — Progress Notes (Signed)
 Electrophysiology Office Note:   Date:  12/24/2023  ID:  Benjamin Andrade, DOB Aug 12, 1935, MRN [id number]  Primary Cardiologist: Norman Herrlich, MD Primary Heart Failure: None Electrophysiologist: Deshundra Waller Jorja Loa, MD      History of Present Illness:   Benjamin Andrade is a 89 y.o. male with h/o coronary artery disease post PCI, hypertension, hyperlipidemia, aortic stenosis, carotid atherosclerosis seen today for  for Electrophysiology evaluation of syncope at the request of Norman Herrlich.  He has had multiple episodes of near syncope.  These episodes occurred 4 months ago.  He has not had any similar episodes since then.  He states that his episodes occurred when he was sitting on a couch.  He would have blackout suddenly.  When he woke up, he felt well.  He says that there is no exacerbating or alleviating factors to these episodes.  He did wear a cardiac monitor, but does not think that he had any similar symptoms while wearing the monitor.  Today, denies symptoms of palpitations, chest pain, shortness of breath, orthopnea, PND, lower extremity edema, claudication, dizziness, bleeding, or neurologic sequela. The patient is tolerating medications without difficulties.      Review of systems complete and found to be negative unless listed in HPI.   EP Information / Studies Reviewed:    EKG is ordered today. Personal review as below.  EKG Interpretation Date/Time:  Wednesday December 24 2023 13:45:27 EDT Ventricular Rate:  51 PR Interval:  156 QRS Duration:  86 QT Interval:  442 QTC Calculation: 407 R Axis:   70  Text Interpretation: Sinus bradycardia with occasional Premature ventricular complexes When compared with ECG of 28-Mar-2016 07:01, Premature ventricular complexes are now Present Confirmed by Marlaina Coburn ([id number]) on 12/24/2023 1:47:53 PM     Risk Assessment/Calculations:              Physical Exam:   VS:  BP 138/82 (BP Location: Left Arm, Patient Position: Sitting, Cuff Size:  Large)   Pulse (!) 51   Ht 5\' 6"  (1.676 m)   Wt 207 lb (93.9 kg)   SpO2 95%   BMI 33.41 kg/m    Wt Readings from Last 3 Encounters:  12/24/23 207 lb (93.9 kg)  08/25/23 215 lb 6.4 oz (97.7 kg)  07/07/23 217 lb (98.4 kg)     GEN: Well nourished, well developed in no acute distress NECK: No JVD; No carotid bruits CARDIAC: Regular rate and rhythm, no murmurs, rubs, gallops RESPIRATORY:  Clear to auscultation without rales, wheezing or rhonchi  ABDOMEN: Soft, non-tender, non-distended EXTREMITIES:  No edema; No deformity   ASSESSMENT AND PLAN:    1.  Near syncope: No obvious cause.  He wore a cardiac monitor that showed some bradycardia and some junctional beats, but does not appear to be a cause for syncope.  Due to that, we Benjamin Andrade plan for ILR implant.  Risks and benefits have been discussed.  Risk include bleeding and infection.  He understands the risks and is agreed to the procedure.  2.  Coronary artery disease: No current angina.  Continue plan per primary cardiology.  3.  SVT: Noted on cardiac monitor.  Minimally symptomatic  4.  Hypertension: Well-controlled  5.  Aortic stenosis: Mild on most recent echo.  Plan per primary cardiology  Follow up with Dr. Elberta Fortis  pending ILR results   Signed, Dinisha Cai Jorja Loa, MD   SURGEON:  Evon Lopezperez Jorja Loa, MD     PREPROCEDURE DIAGNOSIS:  Syncope  POSTPROCEDURE DIAGNOSIS: Syncope     PROCEDURES:   1. Implantable loop recorder implantation    INTRODUCTION:  Benjamin Andrade presents with a history of syncope The costs of loop recorder monitoring have been discussed with the patient.    DESCRIPTION OF PROCEDURE:  Informed written consent was obtained.   Time Out Completed with RN    The patient required no sedation for the procedure today.  Mapping over the patient's chest was performed to identify the area where electrograms were most prominent for ILR recording.  This area was found to be the left parasternal region over  the 4th intercostal space. The patients left chest was therefore prepped and draped in the usual sterile fashion. The skin overlying the left parasternal region was infiltrated with lidocaine for local analgesia.  A 0.5-cm incision was made over the left parasternal region over the 3rd intercostal space.  A subcutaneous ILR pocket was fashioned using a combination of sharp and blunt dissection.  A [employer] Reveal LINQ 2 implantable loop recorder (serial # [id number]) was then placed into the pocket  R waves were very prominent and measuring  0.43 .  Steri- Strips and a sterile dressing were then applied.  There were no early apparent complications.     CONCLUSIONS:   1. Successful implantation of a implantable loop recorder for a history of Syncope.  2. No early apparent complications.   Benjamin Andrade Jorja Loa, MD  Cardiac Electrophysiology

## 2024-01-15 ENCOUNTER — Other Ambulatory Visit: Payer: Self-pay | Admitting: Orthopaedic Surgery

## 2024-01-15 DIAGNOSIS — G8929 Other chronic pain: Secondary | ICD-10-CM

## 2024-01-29 ENCOUNTER — Ambulatory Visit (INDEPENDENT_AMBULATORY_CARE_PROVIDER_SITE_OTHER)

## 2024-01-29 DIAGNOSIS — R001 Bradycardia, unspecified: Secondary | ICD-10-CM | POA: Diagnosis not present

## 2024-01-30 LAB — CUP PACEART REMOTE DEVICE CHECK
Date Time Interrogation Session: 20250515194214
Implantable Pulse Generator Implant Date: 20250409

## 2024-02-01 ENCOUNTER — Ambulatory Visit: Payer: Self-pay | Admitting: Cardiology

## 2024-03-01 ENCOUNTER — Ambulatory Visit

## 2024-03-01 DIAGNOSIS — R55 Syncope and collapse: Secondary | ICD-10-CM

## 2024-03-01 LAB — CUP PACEART REMOTE DEVICE CHECK
Date Time Interrogation Session: 20250615194343
Implantable Pulse Generator Implant Date: 20250409

## 2024-03-04 ENCOUNTER — Encounter

## 2024-03-04 ENCOUNTER — Ambulatory Visit: Payer: Self-pay | Admitting: Cardiology

## 2024-03-09 NOTE — Addendum Note (Signed)
 Addended by: VICCI SELLER A on: 03/09/2024 09:22 AM   Modules accepted: Orders

## 2024-03-09 NOTE — Progress Notes (Signed)
 [facility] Summary Report / Loop Recorder

## 2024-03-26 ENCOUNTER — Ambulatory Visit: Admitting: Cardiology

## 2024-04-01 ENCOUNTER — Ambulatory Visit: Payer: Self-pay | Admitting: Cardiology

## 2024-04-01 ENCOUNTER — Ambulatory Visit (INDEPENDENT_AMBULATORY_CARE_PROVIDER_SITE_OTHER)

## 2024-04-01 DIAGNOSIS — R55 Syncope and collapse: Secondary | ICD-10-CM | POA: Diagnosis not present

## 2024-04-01 LAB — CUP PACEART REMOTE DEVICE CHECK
Date Time Interrogation Session: 20250717003029
Implantable Pulse Generator Implant Date: 20250409

## 2024-04-08 ENCOUNTER — Encounter

## 2024-04-08 NOTE — Progress Notes (Signed)
 [facility] Summary Report / Loop Recorder

## 2024-05-03 ENCOUNTER — Ambulatory Visit (INDEPENDENT_AMBULATORY_CARE_PROVIDER_SITE_OTHER)

## 2024-05-03 DIAGNOSIS — R55 Syncope and collapse: Secondary | ICD-10-CM | POA: Diagnosis not present

## 2024-05-04 LAB — CUP PACEART REMOTE DEVICE CHECK
Date Time Interrogation Session: 20250816233656
Implantable Pulse Generator Implant Date: 20250409

## 2024-05-06 ENCOUNTER — Ambulatory Visit: Payer: Self-pay | Admitting: Cardiology

## 2024-05-09 NOTE — Progress Notes (Unsigned)
 Cardiology Office Note:    Date:  05/14/2024   ID:  Benjamin Andrade, DOB [birth year], MRN [id number]  PCP:  Benjamin Lamar LABOR, MD  Cardiologist:  Benjamin Leiter, MD    Referring MD: Benjamin Lamar LABOR, MD    ASSESSMENT:    1. Bradycardia   2. Coronary artery disease involving native coronary artery of native heart without angina pectoris   3. Essential hypertension   4. Nonrheumatic aortic valve stenosis   5. Mixed hyperlipidemia    PLAN:    In order of problems listed above:  No recurrent bradycardia he has an implanted loop recorder and we have not documented significant arrhythmia. Stable CAD continue his medical therapy including clopidogrel  beta-blocker statin Hypertension continue his ARB Repeat echocardiogram in view of his recent symptoms Check lipid profile   Next appointment: 6 months   Medication Adjustments/Labs and Tests Ordered: Current medicines are reviewed at length with the patient today.  Concerns regarding medicines are outlined above.  Orders Placed This Encounter  Procedures   Comp Met (CMET)   Lipid Profile   ECHOCARDIOGRAM COMPLETE   No orders of the defined types were placed in this encounter.    History of Present Illness:    Benjamin Andrade is a 89 y.o. male with a hx of CAD with PCI and stent left circumflex coronary artery 2014 previously resistant hypertension dyslipidemia mild aortic stenosis mild carotid atherosclerosis and nocturnal sinus pauses last seen 08/25/2023 to evaluate near syncope.  At my request he was seen by EP 12/24/2023 was not felt to have indication for pacemaker and no clear etiology of his pauses and he had an implanted loop recorder.  Device download 05/01/2024 with no tachy/bradycardia or pause episodes.  His echocardiogram in May 2024 showed calcification in the aortic valve with mild mean gradient was 13 mmHg.  Compliance with diet, lifestyle and medications: Yes   about 3 weeks ago he had an episode that lasted 4  hours of his heart racing and chest discomfort with any surprise that he has not been phoned by the device today fortunately has had no recurrence I will reach out to EP Does not have any further discomfort shortness of breath palpitation or syncope He tolerates his lipid-lowering therapy without muscle pain or weakness long-term clopidogrel  without GI upset Past Medical History:  Diagnosis Date   Arthritis    Bradycardia 02/09/2019   Cancer (HCC)    skin-lip-face   Carotid stenosis, bilateral 05/09/2016   Cataract 05/15/2020   Formatting of this note might be different from the original. tramatic catarct OD   Cellulitis of left lower leg 03/07/2021   Chronic gout without tophus 02/21/2017   Chronic insomnia 05/09/2017   Coronary artery disease    Coronary artery disease involving native coronary artery of native heart with angina pectoris (HCC) 08/22/2015   Overview:  80% CX - DES oct 2014 80% CX - DES oct 2014   Diabetes mellitus without complication (HCC)    prediabetic   Dysrhythmia    occ   Essential hypertension 08/22/2015   GERD (gastroesophageal reflux disease)    GERD without esophagitis 05/09/2017   H/O heart artery stent 05/26/2017   Hyperlipemia 08/22/2015   Hypertension    Localized swelling of left lower extremity 03/07/2021   Mixed dyslipidemia 05/09/2017   Myocardial infarction [facility])    Primary osteoarthritis of left knee 03/03/2023   Right-sided chest wall pain 03/07/2021   Rotator cuff arthropathy 03/28/2016   Screening for colon  cancer 05/27/2019   Severe obesity (BMI 35.0-35.9 with comorbidity) (HCC) 11/28/2022   Type 2 diabetes mellitus with hyperglycemia, without long-term current use of insulin  (HCC) 05/09/2017   Vitamin D deficiency 05/09/2017    Current Medications: Current Meds  Medication Sig   amLODipine  (NORVASC ) 5 MG tablet Take 1 tablet (5 mg total) by mouth 2 (two) times daily.   carvedilol  (COREG ) 12.5 MG tablet Take 1 tablet (12.5 mg  total) by mouth 2 (two) times daily.   clopidogrel  (PLAVIX ) 75 MG tablet Take 75 mg by mouth every morning.    diclofenac  (VOLTAREN ) 75 MG EC tablet Take 75 mg by mouth 2 (two) times daily.   finasteride  (PROSCAR ) 5 MG tablet Take 5 mg by mouth daily.   furosemide  (LASIX ) 20 MG tablet Take 1 tablet (20 mg total) by mouth every other day. Take furosemide  daily for 1 week, then every other day.   Multiple Vitamin (MULTI-VITAMINS) TABS Take 1 tablet by mouth every morning.    pantoprazole  (PROTONIX ) 40 MG tablet Take 40 mg by mouth as needed.   polyethylene glycol (MIRALAX ) 17 g packet Take 17 g by mouth daily.   pravastatin  (PRAVACHOL ) 20 MG tablet TAKE 1 TABLET BY MOUTH IN THE  EVENING   telmisartan  (MICARDIS ) 80 MG tablet Take 80 mg by mouth at bedtime.      EKGs/Labs/Other Studies Reviewed:    The following studies were reviewed today:  Cardiac Studies & Procedures   ______________________________________________________________________________________________   STRESS TESTS  MYOCARDIAL PERFUSION IMAGING 01/28/2023  Interpretation Summary   The study is normal. The study is low risk.   No ST deviation was noted.   Left ventricular function is normal. Nuclear stress EF: 64 %. The left ventricular ejection fraction is normal (55-65%). End diastolic cavity size is normal.   Prior study not available for comparison.   ECHOCARDIOGRAM  ECHOCARDIOGRAM COMPLETE 01/28/2023  Narrative ECHOCARDIOGRAM REPORT    Patient Name:   Benjamin Andrade Date of Exam: 01/28/2023 Medical Rec #:  [id number]     Height:       68.0 in Accession #:    [id number]    Weight:       228.0 lb Date of Birth:  [birth year]    BSA:          2.161 m Patient Age:    87 years      BP:           140/90 mmHg Patient Gender: M             HR:           53 bpm. Exam Location:  [facility]  Procedure: 2D Echo, Cardiac Doppler, Color Doppler and Intracardiac Opacification Agent  Indications:    Preoperative examination  [Z01.818 (ICD-10-CM)]  History:        Patient has prior history of Echocardiogram examinations. Previous Myocardial Infarction; Risk Factors:Hypertension and Diabetes.  Sonographer:    Benjamin Andrade RDCS Referring Phys: [phone] DELON JAYSON HOOVER   Sonographer Comments: No subcostal window and suboptimal apical window. Global longitudinal strain was attempted. IMPRESSIONS   1. Left ventricular ejection fraction, by estimation, is 60 to 65%. The left ventricle has normal function. The left ventricle has no regional wall motion abnormalities. There is mild left ventricular hypertrophy. Left ventricular diastolic parameters are consistent with Grade I diastolic dysfunction (impaired relaxation). 2. Right ventricular systolic function is normal. The right ventricular size is normal. 3. Left atrial size was severely dilated. 4. The  mitral valve is normal in structure. Mild mitral valve regurgitation. No evidence of mitral stenosis. 5. Tricuspid valve regurgitation is mild to moderate. 6. The aortic valve is calcified. There is mild calcification of the aortic valve. There is mild thickening of the aortic valve. Aortic valve regurgitation is not visualized. Mild aortic valve stenosis. Aortic valve area, by VTI measures 1.59 cm. Aortic valve mean gradient measures 12.7 mmHg. 7. The inferior vena cava is normal in size with greater than 50% respiratory variability, suggesting right atrial pressure of 3 mmHg.  FINDINGS Left Ventricle: Left ventricular ejection fraction, by estimation, is 60 to 65%. The left ventricle has normal function. The left ventricle has no regional wall motion abnormalities. Definity  contrast agent was given IV to delineate the left ventricular endocardial borders. The left ventricular internal cavity size was normal in size. There is mild left ventricular hypertrophy. Left ventricular diastolic parameters are consistent with Grade I diastolic dysfunction (impaired  relaxation).  Right Ventricle: The right ventricular size is normal. No increase in right ventricular wall thickness. Right ventricular systolic function is normal.  Left Atrium: Left atrial size was severely dilated.  Right Atrium: Right atrial size was normal in size.  Pericardium: There is no evidence of pericardial effusion.  Mitral Valve: The mitral valve is normal in structure. Mild mitral valve regurgitation. No evidence of mitral valve stenosis.  Tricuspid Valve: The tricuspid valve is normal in structure. Tricuspid valve regurgitation is mild to moderate. No evidence of tricuspid stenosis.  Aortic Valve: The aortic valve is calcified. There is mild calcification of the aortic valve. There is mild thickening of the aortic valve. Aortic valve regurgitation is not visualized. Mild aortic stenosis is present. Aortic valve mean gradient measures 12.7 mmHg. Aortic valve peak gradient measures 23.7 mmHg. Aortic valve area, by VTI measures 1.59 cm.  Pulmonic Valve: The pulmonic valve was normal in structure. Pulmonic valve regurgitation is not visualized. No evidence of pulmonic stenosis.  Aorta: The aortic root is normal in size and structure.  Venous: The inferior vena cava is normal in size with greater than 50% respiratory variability, suggesting right atrial pressure of 3 mmHg.  IAS/Shunts: No atrial level shunt detected by color flow Doppler.   LEFT VENTRICLE PLAX 2D LVIDd:         5.10 cm   Diastology LVIDs:         3.20 cm   LV e' medial:    5.22 cm/s LV PW:         1.20 cm   LV E/e' medial:  15.0 LV IVS:        1.40 cm   LV e' lateral:   7.40 cm/s LVOT diam:     2.10 cm   LV E/e' lateral: 10.6 LV SV:         99 LV SV Index:   46 LVOT Area:     3.46 cm   RIGHT VENTRICLE RV Basal diam:  3.30 cm RV S prime:     12.20 cm/s TAPSE (M-mode): 3.0 cm  LEFT ATRIUM              Index        RIGHT ATRIUM           Index LA diam:        4.90 cm  2.27 cm/m   RA Area:      16.70 cm LA Vol (A2C):   95.7 ml  44.29 ml/m  RA Volume:   43.60 ml  20.18 ml/m LA Vol (A4C):   109.0 ml 50.44 ml/m LA Biplane Vol: 103.0 ml 47.67 ml/m AORTIC VALVE AV Area (Vmax):    1.60 cm AV Area (Vmean):   1.53 cm AV Area (VTI):     1.59 cm AV Vmax:           243.67 cm/s AV Vmean:          162.667 cm/s AV VTI:            0.625 m AV Peak Grad:      23.7 mmHg AV Mean Grad:      12.7 mmHg LVOT Vmax:         112.50 cm/s LVOT Vmean:        72.050 cm/s LVOT VTI:          0.286 m LVOT/AV VTI ratio: 0.46  AORTA Ao Root diam: 3.10 cm Ao Asc diam:  3.65 cm  MV E velocity: 78.10 cm/s   TRICUSPID VALVE MV A velocity: 106.00 cm/s  TR Peak grad:   32.3 mmHg MV E/A ratio:  0.74         TR Vmax:        284.00 cm/s  SHUNTS Systemic VTI:  0.29 m Systemic Diam: 2.10 cm  Lamar Fitch MD Electronically signed by Lamar Fitch MD Signature Date/Time: 01/28/2023/3:15:14 PM    Final    MONITORS  LONG TERM MONITOR (3-14 DAYS) 07/07/2023       ______________________________________________________________________________________________          Recent Labs: 07/07/2023: ALT 16; BUN 14; Creatinine, Ser 0.86; Potassium 4.4; Sodium 139  Recent Lipid Panel    Component Value Date/Time   CHOL 123 07/07/2023 0954   TRIG 84 07/07/2023 0954   HDL 41 07/07/2023 0954   CHOLHDL 3.0 07/07/2023 0954   LDLCALC 65 07/07/2023 0954    Physical Exam:    VS:  BP 120/78   Pulse (!) 51   Ht 5' 7 (1.702 m)   Wt 221 lb 6.4 oz (100.4 kg)   SpO2 93%   BMI 34.68 kg/m     Wt Readings from Last 3 Encounters:  05/14/24 221 lb 6.4 oz (100.4 kg)  12/24/23 207 lb (93.9 kg)  08/25/23 215 lb 6.4 oz (97.7 kg)     GEN:  Well nourished, well developed in no acute distress HEENT: Normal NECK: No JVD; No carotid bruits LYMPHATICS: No lymphadenopathy CARDIAC: RRR, no murmurs, rubs, gallops RESPIRATORY:  Clear to auscultation without rales, wheezing or rhonchi  ABDOMEN: Soft,  non-tender, non-distended MUSCULOSKELETAL:  No edema; No deformity  SKIN: Warm and dry NEUROLOGIC:  Alert and oriented x 3 PSYCHIATRIC:  Normal affect    Signed, Benjamin Leiter, MD  05/14/2024 8:25 AM    [facility]

## 2024-05-13 ENCOUNTER — Encounter

## 2024-05-14 ENCOUNTER — Encounter: Payer: Self-pay | Admitting: Cardiology

## 2024-05-14 ENCOUNTER — Ambulatory Visit: Attending: Cardiology | Admitting: Cardiology

## 2024-05-14 VITALS — BP 120/78 | HR 51 | Ht 67.0 in | Wt 221.4 lb

## 2024-05-14 DIAGNOSIS — I10 Essential (primary) hypertension: Secondary | ICD-10-CM | POA: Diagnosis not present

## 2024-05-14 DIAGNOSIS — I350 Nonrheumatic aortic (valve) stenosis: Secondary | ICD-10-CM

## 2024-05-14 DIAGNOSIS — R001 Bradycardia, unspecified: Secondary | ICD-10-CM

## 2024-05-14 DIAGNOSIS — I2510 Atherosclerotic heart disease of native coronary artery without angina pectoris: Secondary | ICD-10-CM

## 2024-05-14 DIAGNOSIS — E782 Mixed hyperlipidemia: Secondary | ICD-10-CM

## 2024-05-14 NOTE — Patient Instructions (Signed)
 Medication Instructions:  Your physician recommends that you continue on your current medications as directed. Please refer to the Current Medication list given to you today.  *If you need a refill on your cardiac medications before your next appointment, please call your pharmacy*  Lab Work: Your physician recommends that you return for lab work in:   Labs today: CMP, Lipids  If you have labs (blood work) drawn today and your tests are completely normal, you will receive your results only by: MyChart Message (if you have MyChart) OR A paper copy in the mail If you have any lab test that is abnormal or we need to change your treatment, we will call you to review the results.  Testing/Procedures: Your physician has requested that you have an echocardiogram. Echocardiography is a painless test that uses sound waves to create images of your heart. It provides your doctor with information about the size and shape of your heart and how well your heart's chambers and valves are working. This procedure takes approximately one hour. There are no restrictions for this procedure. Please do NOT wear cologne, perfume, aftershave, or lotions (deodorant is allowed). Please arrive 15 minutes prior to your appointment time.  Please note: We ask at that you not bring children with you during ultrasound (echo/ vascular) testing. Due to room size and safety concerns, children are not allowed in the ultrasound rooms during exams. Our front office staff cannot provide observation of children in our lobby area while testing is being conducted. An adult accompanying a patient to their appointment will only be allowed in the ultrasound room at the discretion of the ultrasound technician under special circumstances. We apologize for any inconvenience.   Follow-Up: At [facility], you and your health needs are our priority.  As part of our continuing mission to provide you with exceptional heart care, our  providers are all part of one team.  This team includes your primary Cardiologist (physician) and Advanced Practice Providers or APPs (Physician Assistants and Nurse Practitioners) who all work together to provide you with the care you need, when you need it.  Your next appointment:   6 month(s)  Provider:   Redell Leiter, MD    We recommend signing up for the patient portal called MyChart.  Sign up information is provided on this After Visit Summary.  MyChart is used to connect with patients for Virtual Visits (Telemedicine).  Patients are able to view lab/test results, encounter notes, upcoming appointments, etc.  Non-urgent messages can be sent to your provider as well.   To learn more about what you can do with MyChart, go to [email or web address].   Other Instructions None

## 2024-05-15 ENCOUNTER — Encounter: Payer: Self-pay | Admitting: Cardiology

## 2024-05-15 LAB — COMPREHENSIVE METABOLIC PANEL WITH GFR
ALT: 18 IU/L (ref 0–44)
AST: 22 IU/L (ref 0–40)
Albumin: 4.1 g/dL (ref 3.7–4.7)
Alkaline Phosphatase: 68 IU/L (ref 44–121)
BUN/Creatinine Ratio: 15 (ref 10–24)
BUN: 13 mg/dL (ref 8–27)
Bilirubin Total: 0.4 mg/dL (ref 0.0–1.2)
CO2: 22 mmol/L (ref 20–29)
Calcium: 9.3 mg/dL (ref 8.6–10.2)
Chloride: 102 mmol/L (ref 96–106)
Creatinine, Ser: 0.87 mg/dL (ref 0.76–1.27)
Globulin, Total: 2.5 g/dL (ref 1.5–4.5)
Glucose: 101 mg/dL — ABNORMAL HIGH (ref 70–99)
Potassium: 4.7 mmol/L (ref 3.5–5.2)
Sodium: 138 mmol/L (ref 134–144)
Total Protein: 6.6 g/dL (ref 6.0–8.5)
eGFR: 84 mL/min/1.73 (ref >59)

## 2024-05-15 LAB — LIPID PANEL
Chol/HDL Ratio: 3.2 ratio (ref 0.0–5.0)
Cholesterol, Total: 123 mg/dL (ref 100–199)
HDL: 39 mg/dL — ABNORMAL LOW (ref >39)
LDL Chol Calc (NIH): 65 mg/dL (ref 0–99)
Triglycerides: 102 mg/dL (ref 0–149)
VLDL Cholesterol Cal: 19 mg/dL (ref 5–40)

## 2024-06-03 ENCOUNTER — Ambulatory Visit (INDEPENDENT_AMBULATORY_CARE_PROVIDER_SITE_OTHER)

## 2024-06-03 DIAGNOSIS — R55 Syncope and collapse: Secondary | ICD-10-CM | POA: Diagnosis not present

## 2024-06-03 LAB — CUP PACEART REMOTE DEVICE CHECK
Date Time Interrogation Session: 20250916234514
Implantable Pulse Generator Implant Date: 20250409

## 2024-06-07 ENCOUNTER — Ambulatory Visit: Payer: Self-pay | Admitting: Cardiology

## 2024-06-08 NOTE — Progress Notes (Signed)
 Remote Loop Recorder Transmission

## 2024-06-09 NOTE — Progress Notes (Signed)
 Remote Loop Recorder Transmission

## 2024-06-10 ENCOUNTER — Ambulatory Visit: Attending: Cardiology

## 2024-06-10 DIAGNOSIS — I10 Essential (primary) hypertension: Secondary | ICD-10-CM

## 2024-06-10 DIAGNOSIS — R001 Bradycardia, unspecified: Secondary | ICD-10-CM

## 2024-06-10 DIAGNOSIS — I350 Nonrheumatic aortic (valve) stenosis: Secondary | ICD-10-CM

## 2024-06-10 DIAGNOSIS — I2510 Atherosclerotic heart disease of native coronary artery without angina pectoris: Secondary | ICD-10-CM | POA: Diagnosis not present

## 2024-06-10 DIAGNOSIS — E782 Mixed hyperlipidemia: Secondary | ICD-10-CM

## 2024-06-11 LAB — ECHOCARDIOGRAM COMPLETE
AR max vel: 1.46 cm2
AV Area VTI: 1.42 cm2
AV Area mean vel: 1.24 cm2
AV Mean grad: 14.2 mmHg
AV Peak grad: 25.3 mmHg
Ao pk vel: 2.52 m/s
Area-P 1/2: 3.31 cm2
MV M vel: 4.68 m/s
MV Peak grad: 87.6 mmHg
S' Lateral: 3.3 cm

## 2024-06-12 ENCOUNTER — Ambulatory Visit: Payer: Self-pay | Admitting: Cardiology

## 2024-06-17 ENCOUNTER — Encounter

## 2024-06-22 NOTE — Progress Notes (Signed)
 Remote Loop Recorder Transmission

## 2024-07-05 ENCOUNTER — Encounter

## 2024-07-05 ENCOUNTER — Ambulatory Visit: Attending: Cardiology

## 2024-07-05 DIAGNOSIS — R55 Syncope and collapse: Secondary | ICD-10-CM | POA: Diagnosis not present

## 2024-07-06 LAB — CUP PACEART REMOTE DEVICE CHECK
Date Time Interrogation Session: 20251019234319
Implantable Pulse Generator Implant Date: 20250409

## 2024-07-09 NOTE — Progress Notes (Signed)
 Remote Loop Recorder Transmission

## 2024-07-13 ENCOUNTER — Ambulatory Visit: Payer: Self-pay | Admitting: Cardiology

## 2024-07-22 ENCOUNTER — Encounter

## 2024-08-05 ENCOUNTER — Ambulatory Visit: Attending: Cardiology

## 2024-08-05 ENCOUNTER — Encounter

## 2024-08-05 DIAGNOSIS — I471 Supraventricular tachycardia, unspecified: Secondary | ICD-10-CM

## 2024-08-05 LAB — CUP PACEART REMOTE DEVICE CHECK
Date Time Interrogation Session: 20251119233805
Implantable Pulse Generator Implant Date: 20250409

## 2024-08-08 ENCOUNTER — Ambulatory Visit: Payer: Self-pay | Admitting: Cardiology

## 2024-08-09 NOTE — Progress Notes (Signed)
 Remote Loop Recorder Transmission

## 2024-08-26 ENCOUNTER — Encounter

## 2024-09-05 ENCOUNTER — Ambulatory Visit

## 2024-09-05 DIAGNOSIS — R55 Syncope and collapse: Secondary | ICD-10-CM | POA: Diagnosis not present

## 2024-09-06 ENCOUNTER — Encounter

## 2024-09-06 LAB — CUP PACEART REMOTE DEVICE CHECK
Date Time Interrogation Session: 20251220233150
Implantable Pulse Generator Implant Date: 20250409

## 2024-09-06 NOTE — Progress Notes (Signed)
 Remote Loop Recorder Transmission

## 2024-09-10 ENCOUNTER — Ambulatory Visit: Payer: Self-pay | Admitting: Cardiology

## 2024-09-30 ENCOUNTER — Encounter

## 2024-10-06 ENCOUNTER — Ambulatory Visit

## 2024-10-06 DIAGNOSIS — R55 Syncope and collapse: Secondary | ICD-10-CM

## 2024-10-07 ENCOUNTER — Ambulatory Visit: Payer: Self-pay | Admitting: Cardiology

## 2024-10-07 LAB — CUP PACEART REMOTE DEVICE CHECK
Date Time Interrogation Session: 20260120233655
Implantable Pulse Generator Implant Date: 20250409

## 2024-10-09 NOTE — Progress Notes (Signed)
 Remote Loop Recorder Transmission

## 2024-11-06 ENCOUNTER — Ambulatory Visit

## 2024-11-12 ENCOUNTER — Ambulatory Visit: Admitting: Cardiology

## 2024-12-07 ENCOUNTER — Ambulatory Visit
